# Patient Record
Sex: Female | Born: 1980 | Race: White | Hispanic: No | Marital: Married | Smoking: Former smoker
Health system: Southern US, Community
[De-identification: ages and names within clinical notes are randomized; demographics above are authoritative.]

## PROBLEM LIST (undated history)

## (undated) DIAGNOSIS — Z803 Family history of malignant neoplasm of breast: Secondary | ICD-10-CM

## (undated) DIAGNOSIS — Z87442 Personal history of urinary calculi: Secondary | ICD-10-CM

## (undated) DIAGNOSIS — D649 Anemia, unspecified: Secondary | ICD-10-CM

## (undated) DIAGNOSIS — K219 Gastro-esophageal reflux disease without esophagitis: Secondary | ICD-10-CM

## (undated) DIAGNOSIS — R51 Headache: Secondary | ICD-10-CM

## (undated) DIAGNOSIS — Z1371 Encounter for nonprocreative screening for genetic disease carrier status: Secondary | ICD-10-CM

## (undated) DIAGNOSIS — R519 Headache, unspecified: Secondary | ICD-10-CM

## (undated) DIAGNOSIS — Z9189 Other specified personal risk factors, not elsewhere classified: Secondary | ICD-10-CM

## (undated) HISTORY — DX: Other specified personal risk factors, not elsewhere classified: Z91.89

## (undated) HISTORY — PX: TUBAL LIGATION: SHX77

## (undated) HISTORY — DX: Encounter for nonprocreative screening for genetic disease carrier status: Z13.71

## (undated) HISTORY — PX: APPENDECTOMY: SHX54

## (undated) HISTORY — PX: WISDOM TOOTH EXTRACTION: SHX21

## (undated) HISTORY — DX: Family history of malignant neoplasm of breast: Z80.3

---

## 2004-03-05 ENCOUNTER — Emergency Department: Payer: Self-pay | Admitting: Unknown Physician Specialty

## 2004-07-28 ENCOUNTER — Emergency Department: Payer: Self-pay | Admitting: Emergency Medicine

## 2005-11-24 ENCOUNTER — Emergency Department: Payer: Self-pay

## 2006-01-06 ENCOUNTER — Ambulatory Visit: Payer: Self-pay

## 2010-01-07 ENCOUNTER — Emergency Department: Payer: Self-pay | Admitting: Emergency Medicine

## 2010-09-29 ENCOUNTER — Emergency Department: Payer: Self-pay | Admitting: Internal Medicine

## 2015-03-04 HISTORY — PX: BREAST EXCISIONAL BIOPSY: SUR124

## 2015-03-04 HISTORY — PX: BREAST BIOPSY: SHX20

## 2015-07-11 ENCOUNTER — Ambulatory Visit
Admission: RE | Admit: 2015-07-11 | Discharge: 2015-07-11 | Disposition: A | Discharge status: Home / Self Care | Payer: Self-pay | Source: Ambulatory Visit | Attending: Oncology | Admitting: Oncology

## 2015-07-11 ENCOUNTER — Ambulatory Visit: Payer: Self-pay | Attending: Oncology

## 2015-07-11 VITALS — BP 119/80 | HR 90 | Temp 99.2°F | Ht 65.75 in | Wt 187.4 lb

## 2015-07-11 DIAGNOSIS — N63 Unspecified lump in unspecified breast: Secondary | ICD-10-CM

## 2015-07-11 NOTE — Progress Notes (Signed)
Subjective:     Patient ID: Angie Weber, female   DOB: May 12, 1980, 35 y.o.   MRN: [id number]  HPI   Review of Systems     Objective:   Physical Exam  Pulmonary/Chest: Right breast exhibits no inverted nipple, no mass, no nipple discharge, no skin change and no tenderness. Left breast exhibits no inverted nipple, no mass, no nipple discharge, no skin change and no tenderness. Breasts are symmetrical.    Bilateral fibroglandular tissue       Assessment:    35 year old patient presents for [facility] visit.  Patient screened, and meets BCCCP eligibility.  Patient does not have insurance, Medicare or Medicaid.  Handout given on Affordable Care Act. Instructed patient on breast self-exam using teach back method.   Patient reports she has 4 maternal aunts, and her maternal grandmother diagnosed with breast cancer.  The youngest aunt was diagnosed in her thirties, and the remainder in their 73's.  Patient's husband present for exam , states he felt something like a mass in her left breast at 12:30, and patient feels mass at 5:30.  On clinical breast exam, palpated bilateral soft mobile nodularity with more prominence left breast 12:30.    Plan:     Sent for bilateral diagnostic mammogram with tomo,  and left breast ultrasound. Patient is on her menstrual cycle, and will be scheduled for pap.  She has not had a pap smear in 13 years.

## 2015-07-12 ENCOUNTER — Other Ambulatory Visit: Payer: Self-pay | Admitting: *Deleted

## 2015-07-12 DIAGNOSIS — N63 Unspecified lump in unspecified breast: Secondary | ICD-10-CM

## 2015-07-23 ENCOUNTER — Ambulatory Visit
Admission: RE | Admit: 2015-07-23 | Discharge: 2015-07-23 | Disposition: A | Discharge status: Home / Self Care | Payer: Self-pay | Source: Ambulatory Visit | Attending: Oncology | Admitting: Oncology

## 2015-07-23 ENCOUNTER — Other Ambulatory Visit: Payer: Self-pay | Admitting: *Deleted

## 2015-07-23 DIAGNOSIS — N63 Unspecified lump in unspecified breast: Secondary | ICD-10-CM

## 2015-07-23 HISTORY — PX: BREAST BIOPSY: SHX20

## 2015-07-24 LAB — SURGICAL PATHOLOGY

## 2015-07-24 NOTE — Progress Notes (Addendum)
Benign biopsy results received.   Spoke to Dr. Jamal Collin regarding referral for intraductal papilloma, and fibroadenoma negative for atypia, and negative for malignancy.  If patient is bothered by fibroadenoma, he will see her for consult regarding excision, otherwise no consult needed.  Phoned patient with results. She is to return to [facility] 07/25/15 for pap.  She will notify then whether she wants consult or not.  Copy to [facility].

## 2015-07-25 ENCOUNTER — Encounter: Payer: Self-pay | Admitting: *Deleted

## 2015-07-25 ENCOUNTER — Ambulatory Visit: Payer: Self-pay | Attending: Oncology | Admitting: *Deleted

## 2015-07-25 DIAGNOSIS — Z0000 Encounter for general adult medical examination without abnormal findings: Secondary | ICD-10-CM

## 2015-07-25 NOTE — Progress Notes (Signed)
Subjective:     Patient ID: Angie Weber, female   DOB: 1980-05-22, 35 y.o.   MRN: [id number]  HPI   Review of Systems     Objective:   Physical Exam  Genitourinary: No labial fusion. There is no rash, tenderness, lesion or injury on the right labia. There is no rash, tenderness, lesion or injury on the left labia. Cervix exhibits no motion tenderness, no discharge and no friability. Right adnexum displays no mass, no tenderness and no fullness. Left adnexum displays no mass, no tenderness and no fullness. No erythema, tenderness or bleeding in the vagina. No foreign body around the vagina. No signs of injury around the vagina. No vaginal discharge found.  Cervix is tilted and the os is located at 1:00       Assessment:     35 year old White female returns to [facility] for her pap only.  Had bilateral breast biopsy last week.  Al Pimple, RN dicussed results of the biopsy with Dr. Jamal Collin and the patient yesterday.  Patient states she does not want to have the fibroadenoma removed at this time.  Recommended that she follow up with mammogram next year.  She is agreeable.  Pelvic exam very difficult and could not visualize the cervical os after 3 attempts.  Discussed case with Dr. Fransisca Connors, our GYN Oncologist, and he came in and collected the pap smear specimen .  The cervical os was tilted upright and to the right at 1:00.      Plan:     Will follow up per BCCCP protocol.

## 2015-07-26 ENCOUNTER — Encounter: Payer: Self-pay | Admitting: *Deleted

## 2015-07-26 NOTE — Progress Notes (Signed)
Per Dr. Glennon Mac, radiologist, surgical consult recommended to discuss screening Breast MRI, and excision of ductal papilloma.  Scheduled appointment with Dr. Jamal Collin on Thursday August 09, 2015 at 2:45.  Also explained to patient that a 6 month follow-up breast ultrasound will be scheduled.

## 2015-07-28 LAB — PAP LB AND HPV HIGH-RISK
HPV, HIGH-RISK: POSITIVE — AB
PAP SMEAR COMMENT: 0

## 2015-08-03 ENCOUNTER — Encounter: Payer: Self-pay | Admitting: *Deleted

## 2015-08-09 ENCOUNTER — Ambulatory Visit (INDEPENDENT_AMBULATORY_CARE_PROVIDER_SITE_OTHER): Payer: PRIVATE HEALTH INSURANCE | Admitting: General Surgery

## 2015-08-09 ENCOUNTER — Encounter: Payer: Self-pay | Admitting: General Surgery

## 2015-08-09 VITALS — BP 100/60 | HR 80 | Resp 12 | Ht 64.5 in | Wt 188.0 lb

## 2015-08-09 DIAGNOSIS — Z803 Family history of malignant neoplasm of breast: Secondary | ICD-10-CM | POA: Diagnosis not present

## 2015-08-09 DIAGNOSIS — D369 Benign neoplasm, unspecified site: Secondary | ICD-10-CM

## 2015-08-09 NOTE — Progress Notes (Signed)
Patient ID: Angie Weber, female   DOB: [birth year], 35 y.o.   MRN: [id number]  Chief Complaint  Patient presents with  . Breast Problem    mass    HPI Angie Weber is a 35 y.o. female. Here today for breast evaluation. She states she noticed a breast lump back in April with some tenderness.  She noticed a couple of lumps in the left breast and sought evaluation by BCCCP. She denies any breast injury or trauma.  Right breast biopsy done 07-23-15 showing papilloma.   HPI  History reviewed. No pertinent past medical history.  Past Surgical History  Procedure Laterality Date  . Breast biopsy Right 07-23-15    BENIGN BREAST TISSUE WITH INTRADUCTAL PAPILLOMA AND COLUMNAR CELL     Family History  Problem Relation Age of Onset  . Breast cancer Maternal Aunt     4 mat. aunts 41's and 34's  . Breast cancer Maternal Grandmother   . Breast cancer Cousin     5 maternal cousins    Social History Social History  Substance Use Topics  . Smoking status: Current Every Day Smoker  . Smokeless tobacco: None  . Alcohol Use: No    Allergies  Allergen Reactions  . Penicillins Anaphylaxis  . Sulfur Hives    No current outpatient prescriptions on file.   No current facility-administered medications for this visit.    Review of Systems Review of Systems  Constitutional: Negative.   Respiratory: Negative.   Cardiovascular: Negative.     Blood pressure 100/60, pulse 80, resp. rate 12, height 5' 4.5" (1.638 m), weight 188 lb (85.276 kg), last menstrual period 08/06/2015.  Physical Exam Physical Exam  Constitutional: She is oriented to person, place, and time. She appears well-developed and well-nourished.  HENT:  Mouth/Throat: Oropharynx is clear and moist.  Eyes: Conjunctivae are normal. No scleral icterus.  Neck: Neck supple.  Cardiovascular: Normal rate, regular rhythm and normal heart sounds.   Pulmonary/Chest: Effort normal and breath sounds normal. Right breast exhibits no  inverted nipple, no mass, no nipple discharge, no skin change and no tenderness. Left breast exhibits mass. Left breast exhibits no inverted nipple, no nipple discharge, no skin change and no tenderness.  1 cm soft fatty mass left breast at 1 o'clock location periphery of breast.  Lymphadenopathy:    She has no cervical adenopathy.    She has no axillary adenopathy.  Neurological: She is alert and oriented to person, place, and time.  Skin: Skin is warm and dry.  Psychiatric: Her behavior is normal.    Data Reviewed Prior notes and pathology.  Assessment    Intraductal Papilloma right breast. Left breast biopsy with benign breast tissue.  No need for further surgical intervention. Risk status based on multiple members in family with breast cancer though not immediate family members.     Plan    Recommend genetic testing, BCCCP to investigate funding. MRI based on genetic test and approval.  Patient agreeable to plan.      PCP:  No Pcp Per Patient Ref BCCCP   SANKAR,SEEPLAPUTHUR G 08/12/2015, 9:40 AM

## 2015-08-09 NOTE — Patient Instructions (Signed)
The patient is aware to call back for any questions or concerns.  

## 2015-08-12 ENCOUNTER — Encounter: Payer: Self-pay | Admitting: General Surgery

## 2015-08-17 ENCOUNTER — Encounter: Payer: Self-pay | Admitting: *Deleted

## 2015-08-17 DIAGNOSIS — Z1371 Encounter for nonprocreative screening for genetic disease carrier status: Secondary | ICD-10-CM

## 2015-08-17 HISTORY — DX: Encounter for nonprocreative screening for genetic disease carrier status: Z13.71

## 2015-08-20 NOTE — Progress Notes (Signed)
Dr. Jamal Collin has met with patient and recommended that she have genetic testing based on her family history. Per Dr. Angie Fava request, I met with patient to review plan for testing.  Patient meets NCCN guidelines for genetic testing based on the following family history: (3) maternal aunts with breast cancer at ages 54, 15, and one at 75, (80) maternal first cousins with breast cancer diagnosed at ages 64, 52 and 51, 1 maternal first cousin with stomach and bowel cancer at age 18, maternal grandmother with lung cancer at age 61 and a paternal grandfather with lymphoma at age 85.  Since the patient is uninsured a [facility] financial assistance form was completed and shipped along with all paperwork, and specimen to [facility] via [employer].  Informed patient that Dr. Jamal Collin will get her results and review them with her.  She is to call if she has any questions or needs.  She is agreeable.

## 2015-08-21 ENCOUNTER — Telehealth: Payer: Self-pay | Admitting: *Deleted

## 2015-08-21 NOTE — Telephone Encounter (Signed)
Left patient a message to return my call.  I have her pap results and would like to review them with her.

## 2015-09-05 ENCOUNTER — Encounter: Payer: Self-pay | Admitting: *Deleted

## 2015-09-06 ENCOUNTER — Telehealth: Payer: Self-pay | Admitting: *Deleted

## 2015-09-06 NOTE — Telephone Encounter (Signed)
Notify patient that her BRCA genetic testing was negative. Her copy is at the office to pick up per Dr Jamal Collin. Thanks.

## 2015-09-06 NOTE — Telephone Encounter (Signed)
Notified patient as instructed, patient pleased. Discussed follow-up appointments, patient agrees  

## 2015-09-06 NOTE — Telephone Encounter (Signed)
-----   Message from Christene Lye, MD sent at 09/06/2015  8:13 AM EDT ----- Please inform pt her my risk assessment was negative. Please schedule her for right diagnostic mammogram in November with office appt after

## 2015-09-10 ENCOUNTER — Encounter: Payer: Self-pay | Admitting: General Surgery

## 2015-09-13 ENCOUNTER — Encounter: Payer: Self-pay | Admitting: *Deleted

## 2015-09-13 NOTE — Progress Notes (Signed)
Letter mailed to inform patient of her pap results and need to return in one year.  [facility] to [place].

## 2015-10-15 ENCOUNTER — Encounter: Payer: Self-pay | Admitting: *Deleted

## 2015-10-25 ENCOUNTER — Ambulatory Visit: Payer: PRIVATE HEALTH INSURANCE | Admitting: General Surgery

## 2015-11-19 ENCOUNTER — Encounter: Payer: Self-pay | Admitting: *Deleted

## 2015-11-26 ENCOUNTER — Ambulatory Visit: Payer: PRIVATE HEALTH INSURANCE | Admitting: General Surgery

## 2015-12-20 ENCOUNTER — Encounter: Payer: Self-pay | Admitting: *Deleted

## 2015-12-26 ENCOUNTER — Inpatient Hospital Stay: Payer: Self-pay

## 2015-12-26 ENCOUNTER — Ambulatory Visit (INDEPENDENT_AMBULATORY_CARE_PROVIDER_SITE_OTHER): Payer: PRIVATE HEALTH INSURANCE | Admitting: General Surgery

## 2015-12-26 ENCOUNTER — Encounter: Payer: Self-pay | Admitting: General Surgery

## 2015-12-26 VITALS — BP 120/80 | HR 82 | Resp 12 | Ht 64.0 in | Wt 180.0 lb

## 2015-12-26 DIAGNOSIS — N6310 Unspecified lump in the right breast, unspecified quadrant: Secondary | ICD-10-CM

## 2015-12-26 NOTE — Patient Instructions (Signed)
Patient to return for a right breast excision at [facility].

## 2015-12-26 NOTE — Progress Notes (Signed)
Patient ID: Angie Weber, female   DOB: 1980-08-07, 35 y.o.   MRN: [id number]  Chief Complaint  Patient presents with  . Follow-up    right breast    HPI Angie Weber is a 35 y.o. female here today for her follow up right breast papilloma . Patient states no  New breast symptoms.Since last visit she had BRCA testing which was negative.  I have reviewed the history of present illness with the patient.  HPI  Past Medical History:  Diagnosis Date  . BRCA negative 08-17-15   [facility]    Past Surgical History:  Procedure Laterality Date  . BREAST BIOPSY Right 07-23-15   BENIGN BREAST TISSUE WITH INTRADUCTAL PAPILLOMA AND COLUMNAR CELL     Family History  Problem Relation Age of Onset  . Breast cancer Maternal Aunt     4 mat. aunts 84's and 80's  . Breast cancer Maternal Grandmother   . Breast cancer Cousin     5 maternal cousins    Social History Social History  Substance Use Topics  . Smoking status: Current Every Day Smoker  . Smokeless tobacco: Not on file  . Alcohol use No    Allergies  Allergen Reactions  . Penicillins Anaphylaxis  . Sulfur Hives    No current outpatient prescriptions on file.   No current facility-administered medications for this visit.     Review of Systems Review of Systems  Blood pressure 120/80, pulse 82, resp. rate 12, height _0  (1.626 m), weight 180 lb (81.6 kg).  Physical Exam Physical Exam  Constitutional: She is oriented to person, place, and time. She appears well-developed and well-nourished.  Eyes: Conjunctivae are normal. No scleral icterus.  Neck: Neck supple.  Cardiovascular: Normal rate, regular rhythm and normal heart sounds.   Pulmonary/Chest: Effort normal and breath sounds normal. Right breast exhibits no inverted nipple, no mass, no nipple discharge, no skin change and no tenderness. Left breast exhibits no inverted nipple, no mass, no nipple discharge, no skin change and no tenderness.  Abdominal: Soft. Bowel  sounds are normal. There is no tenderness.  Lymphadenopathy:    She has no cervical adenopathy.    She has no axillary adenopathy.  Neurological: She is alert and oriented to person, place, and time.  Skin: Skin is warm and dry.    Data Reviewed Prior notes and ultrasound Path- one mass at 3.30 ocl near right nipple was a papilloma. Other biopsies were benign Assessment    [place] repeated today again shows a irregular 57m hypoechoic mass at 3.30 ocl near right nipple.    Plan   Right breast papilloma. Recommended excision. Pt is agreeable.    [facility]       This information has been scribed by JGaspar ColaCMA. Melvin Marmo G 12/26/2015, 12:32 PM

## 2015-12-27 NOTE — Progress Notes (Signed)
Received call from [facility] at [facility].  Patient is scheduled for excision of right breast mass on 01/11/16.  Informed office, and patient that BCCCP does not pay for all of the surgical procedure, and that patient will need to fill out Patient Financial  Assistance application.  Sent application to patient.  Notified pre-admit testing to apply BCCCP billing flag to patient registration .

## 2016-01-03 ENCOUNTER — Inpatient Hospital Stay: Admission: RE | Admit: 2016-01-03 | Payer: Self-pay | Source: Ambulatory Visit

## 2016-01-04 ENCOUNTER — Inpatient Hospital Stay: Admission: RE | Admit: 2016-01-04 | Payer: Self-pay | Source: Ambulatory Visit

## 2016-01-07 ENCOUNTER — Encounter
Admission: RE | Admit: 2016-01-07 | Discharge: 2016-01-07 | Disposition: A | Discharge status: Home / Self Care | Payer: Self-pay | Source: Ambulatory Visit | Attending: General Surgery | Admitting: General Surgery

## 2016-01-07 HISTORY — DX: Personal history of urinary calculi: Z87.442

## 2016-01-07 HISTORY — DX: Headache, unspecified: R51.9

## 2016-01-07 HISTORY — DX: Gastro-esophageal reflux disease without esophagitis: K21.9

## 2016-01-07 HISTORY — DX: Anemia, unspecified: D64.9

## 2016-01-07 HISTORY — DX: Headache: R51

## 2016-01-07 NOTE — Patient Instructions (Signed)
  Your procedure is scheduled on: 01-11-16 Report to Same Day Surgery 2nd floor medical mall To find out your arrival time please call [phone] between 1PM - 3PM on 01-10-16  Remember: Instructions that are not followed completely may result in serious medical risk, up to and including death, or upon the discretion of your surgeon and anesthesiologist your surgery may need to be rescheduled.    _x___ 1. Do not eat food or drink liquids after midnight. No gum chewing or hard candies.     __x__ 2. No Alcohol for 24 hours before or after surgery.   __x__3. No Smoking for 24 prior to surgery.   ____  4. Bring all medications with you on the day of surgery if instructed.    __x__ 5. Notify your doctor if there is any change in your medical condition     (cold, fever, infections).     Do not wear jewelry, make-up, hairpins, clips or nail polish.  Do not wear lotions, powders, or perfumes. You may wear deodorant.  Do not shave 48 hours prior to surgery. Men may shave face and neck.  Do not bring valuables to the hospital.    [facility] is not responsible for any belongings or valuables.               Contacts, dentures or bridgework may not be worn into surgery.  Leave your suitcase in the car. After surgery it may be brought to your room.  For patients admitted to the hospital, discharge time is determined by your treatment team.   Patients discharged the day of surgery will not be allowed to drive home.    Please read over the following fact sheets that you were given:   [facility] Preparing for Surgery and or MRSA Information   ____ Take these medicines the morning of surgery with A SIP OF WATER:    1. NONE  2.  3.  4.  5.  6.  ____Fleets enema or Magnesium Citrate as directed.   ____ Use CHG Soap or sage wipes as directed on instruction sheet   ____ Use inhalers on the day of surgery and bring to hospital day of surgery  ____ Stop metformin 2 days prior to  surgery    ____ Take 1/2 of usual insulin dose the night before surgery and none on the morning of  surgery.   _X___ Stop aspirin or coumadin, or plavix-STOP EXCEDRIN MIGRAINE NOW  x__ Stop Anti-inflammatories such as Advil, Aleve, Ibuprofen, Motrin, Naproxen,          Naprosyn, Goodies powders or aspirin products NOW-Ok to take Tylenol.   _X___ Stop supplements until after surgery-STOP MELATONIN NOW  ____ Bring C-Pap to the hospital.

## 2016-01-11 ENCOUNTER — Ambulatory Visit
Admission: RE | Admit: 2016-01-11 | Discharge: 2016-01-11 | Disposition: A | Discharge status: Home / Self Care | Payer: MEDICAID | Source: Ambulatory Visit | Attending: General Surgery | Admitting: General Surgery

## 2016-01-11 ENCOUNTER — Encounter
Admission: RE | Disposition: A | Discharge status: Home / Self Care | Payer: Self-pay | Source: Ambulatory Visit | Attending: General Surgery

## 2016-01-11 ENCOUNTER — Ambulatory Visit: Payer: MEDICAID | Admitting: Anesthesiology

## 2016-01-11 ENCOUNTER — Encounter: Payer: Self-pay | Admitting: *Deleted

## 2016-01-11 DIAGNOSIS — F17200 Nicotine dependence, unspecified, uncomplicated: Secondary | ICD-10-CM | POA: Insufficient documentation

## 2016-01-11 DIAGNOSIS — Z9889 Other specified postprocedural states: Secondary | ICD-10-CM | POA: Insufficient documentation

## 2016-01-11 DIAGNOSIS — Z803 Family history of malignant neoplasm of breast: Secondary | ICD-10-CM | POA: Insufficient documentation

## 2016-01-11 DIAGNOSIS — N6310 Unspecified lump in the right breast, unspecified quadrant: Secondary | ICD-10-CM

## 2016-01-11 DIAGNOSIS — Z880 Allergy status to penicillin: Secondary | ICD-10-CM | POA: Insufficient documentation

## 2016-01-11 DIAGNOSIS — D241 Benign neoplasm of right breast: Secondary | ICD-10-CM | POA: Diagnosis not present

## 2016-01-11 DIAGNOSIS — Z882 Allergy status to sulfonamides status: Secondary | ICD-10-CM | POA: Insufficient documentation

## 2016-01-11 HISTORY — PX: BREAST LUMPECTOMY: SHX2

## 2016-01-11 LAB — POCT PREGNANCY, URINE: Preg Test, Ur: NEGATIVE

## 2016-01-11 SURGERY — BREAST LUMPECTOMY
Anesthesia: General | Site: Breast | Laterality: Right | Wound class: Clean

## 2016-01-11 MED ORDER — TRAMADOL HCL 50 MG PO TABS: 50.0000 mg | ORAL_TABLET | Freq: Four times a day (QID) | ORAL | 0 refills | Status: DC | PRN

## 2016-01-11 MED ORDER — FAMOTIDINE 20 MG PO TABS
20.0000 mg | ORAL_TABLET | Freq: Once | ORAL | Status: AC
  Administered 2016-01-11: 20 mg via ORAL

## 2016-01-11 MED ORDER — BUPIVACAINE HCL (PF) 0.5 % IJ SOLN
INTRAMUSCULAR | Status: DC | PRN
  Administered 2016-01-11: 15 mL

## 2016-01-11 MED ORDER — FENTANYL CITRATE (PF) 100 MCG/2ML IJ SOLN: 25.0000 ug | INTRAMUSCULAR | Status: DC | PRN

## 2016-01-11 MED ORDER — LACTATED RINGERS IV SOLN
INTRAVENOUS | Status: DC
  Administered 2016-01-11: 07:00:00 via INTRAVENOUS

## 2016-01-11 MED ORDER — SODIUM CHLORIDE 0.9 % IJ SOLN
INTRAMUSCULAR | Status: AC
  Filled 2016-01-11: qty 50

## 2016-01-11 MED ORDER — PROPOFOL 10 MG/ML IV BOLUS
INTRAVENOUS | Status: DC | PRN
  Administered 2016-01-11: 200 mg via INTRAVENOUS

## 2016-01-11 MED ORDER — KETOROLAC TROMETHAMINE 30 MG/ML IJ SOLN
INTRAMUSCULAR | Status: DC | PRN
  Administered 2016-01-11: 30 mg via INTRAVENOUS

## 2016-01-11 MED ORDER — ONDANSETRON HCL 4 MG/2ML IJ SOLN: 4.0000 mg | Freq: Once | INTRAMUSCULAR | Status: DC | PRN

## 2016-01-11 MED ORDER — GLYCOPYRROLATE 0.2 MG/ML IJ SOLN
INTRAMUSCULAR | Status: DC | PRN
  Administered 2016-01-11: 0.2 mg via INTRAVENOUS

## 2016-01-11 MED ORDER — FAMOTIDINE 20 MG PO TABS
ORAL_TABLET | ORAL | Status: AC
  Filled 2016-01-11: qty 1

## 2016-01-11 MED ORDER — LIDOCAINE HCL (CARDIAC) 20 MG/ML IV SOLN
INTRAVENOUS | Status: DC | PRN
  Administered 2016-01-11: 100 mg via INTRAVENOUS

## 2016-01-11 MED ORDER — METHYLENE BLUE 0.5 % INJ SOLN
INTRAVENOUS | Status: AC
  Filled 2016-01-11: qty 10

## 2016-01-11 MED ORDER — BUPIVACAINE HCL (PF) 0.5 % IJ SOLN
INTRAMUSCULAR | Status: AC
  Filled 2016-01-11: qty 30

## 2016-01-11 MED ORDER — FENTANYL CITRATE (PF) 100 MCG/2ML IJ SOLN
INTRAMUSCULAR | Status: DC | PRN
  Administered 2016-01-11 (×2): 50 ug via INTRAVENOUS

## 2016-01-11 MED ORDER — CHLORHEXIDINE GLUCONATE CLOTH 2 % EX PADS: 6.0000 | MEDICATED_PAD | Freq: Once | CUTANEOUS | Status: DC

## 2016-01-11 MED ORDER — MIDAZOLAM HCL 2 MG/2ML IJ SOLN
INTRAMUSCULAR | Status: DC | PRN
Start: 2016-01-11 — End: 2016-01-11
  Administered 2016-01-11: 2 mg via INTRAVENOUS

## 2016-01-11 MED ORDER — ONDANSETRON HCL 4 MG/2ML IJ SOLN
INTRAMUSCULAR | Status: DC | PRN
  Administered 2016-01-11: 4 mg via INTRAVENOUS

## 2016-01-11 SURGICAL SUPPLY — 32 items
BLADE SURG 15 STRL SS SAFETY (BLADE) ×6 IMPLANT
BULB RESERV EVAC DRAIN JP 100C (MISCELLANEOUS) IMPLANT
CANISTER SUCT 1200ML W/VALVE (MISCELLANEOUS) ×3 IMPLANT
CHLORAPREP W/TINT 26ML (MISCELLANEOUS) ×3 IMPLANT
CNTNR SPEC 2.5X3XGRAD LEK (MISCELLANEOUS) ×2
CONT SPEC 4OZ STER OR WHT (MISCELLANEOUS) ×4
CONTAINER SPEC 2.5X3XGRAD LEK (MISCELLANEOUS) ×2 IMPLANT
COVER PROBE FLX POLY STRL (MISCELLANEOUS) IMPLANT
DERMABOND ADVANCED (GAUZE/BANDAGES/DRESSINGS) ×2
DERMABOND ADVANCED .7 DNX12 (GAUZE/BANDAGES/DRESSINGS) ×1 IMPLANT
DEVICE LOCALIZATION ULTRAWIRE (WIRE) IMPLANT
DRAIN CHANNEL JP 15F RND 16 (MISCELLANEOUS) IMPLANT
DRAPE LAPAROTOMY TRNSV 106X77 (MISCELLANEOUS) ×3 IMPLANT
ELECT REM PT RETURN 9FT ADLT (ELECTROSURGICAL) ×3
ELECTRODE REM PT RTRN 9FT ADLT (ELECTROSURGICAL) ×1 IMPLANT
GLOVE BIO SURGEON STRL SZ7 (GLOVE) ×3 IMPLANT
GOWN STRL REUS W/ TWL LRG LVL3 (GOWN DISPOSABLE) ×3 IMPLANT
GOWN STRL REUS W/TWL LRG LVL3 (GOWN DISPOSABLE) ×6
HARMONIC SCALPEL FOCUS (MISCELLANEOUS) IMPLANT
KIT RM TURNOVER STRD PROC AR (KITS) ×3 IMPLANT
LABEL OR SOLS (LABEL) ×3 IMPLANT
LIQUID BAND (GAUZE/BANDAGES/DRESSINGS) ×3 IMPLANT
MARGIN MAP 10MM (MISCELLANEOUS) ×3 IMPLANT
NEEDLE HYPO 25X1 1.5 SAFETY (NEEDLE) ×3 IMPLANT
PACK BASIN MINOR ARMC (MISCELLANEOUS) ×3 IMPLANT
SUT ETH BLK MONO 3 0 FS 1 12/B (SUTURE) ×3 IMPLANT
SUT MNCRL AB 3-0 PS2 27 (SUTURE) ×3 IMPLANT
SUT VIC AB 2-0 BRD 54 (SUTURE) ×3 IMPLANT
SUT VIC AB 2-0 CT2 27 (SUTURE) ×6 IMPLANT
SYR CONTROL 10ML (SYRINGE) ×3 IMPLANT
ULTRAWIRE LOCALIZATION DEVICE (WIRE)
WATER STERILE IRR 1000ML POUR (IV SOLUTION) ×3 IMPLANT

## 2016-01-11 NOTE — Anesthesia Procedure Notes (Signed)
Procedure Name: LMA Insertion Date/Time: 01/11/2016 7:27 AM Performed by: Doreen Salvage Pre-anesthesia Checklist: Patient identified, Patient being monitored, Timeout performed, Emergency Drugs available and Suction available Patient Re-evaluated:Patient Re-evaluated prior to inductionOxygen Delivery Method: Circle system utilized Preoxygenation: Pre-oxygenation with 100% oxygen Intubation Type: IV induction Ventilation: Mask ventilation without difficulty LMA: LMA inserted LMA Size: 3.5 Tube type: Oral Number of attempts: 1 Placement Confirmation: positive ETCO2 and breath sounds checked- equal and bilateral Tube secured with: Tape Dental Injury: Teeth and Oropharynx as per pre-operative assessment

## 2016-01-11 NOTE — Discharge Instructions (Signed)

## 2016-01-11 NOTE — H&P (View-Only) (Signed)
Patient ID: Angie Weber, female   DOB: 09/06/1980, 35 y.o.   MRN: [id number]  Chief Complaint  Patient presents with  . Follow-up    right breast    HPI Angie Weber is a 35 y.o. female here today for her follow up right breast papilloma . Patient states no  New breast symptoms.Since last visit she had BRCA testing which was negative.  I have reviewed the history of present illness with the patient.  HPI  Past Medical History:  Diagnosis Date  . BRCA negative 08-17-15   MyRisk    Past Surgical History:  Procedure Laterality Date  . BREAST BIOPSY Right 07-23-15   BENIGN BREAST TISSUE WITH INTRADUCTAL PAPILLOMA AND COLUMNAR CELL     Family History  Problem Relation Age of Onset  . Breast cancer Maternal Aunt     4 mat. aunts 30's and 40's  . Breast cancer Maternal Grandmother   . Breast cancer Cousin     5 maternal cousins    Social History Social History  Substance Use Topics  . Smoking status: Current Every Day Smoker  . Smokeless tobacco: Not on file  . Alcohol use No    Allergies  Allergen Reactions  . Penicillins Anaphylaxis  . Sulfur Hives    No current outpatient prescriptions on file.   No current facility-administered medications for this visit.     Review of Systems Review of Systems  Blood pressure 120/80, pulse 82, resp. rate 12, height 5' 4" (1.626 m), weight 180 lb (81.6 kg).  Physical Exam Physical Exam  Constitutional: She is oriented to person, place, and time. She appears well-developed and well-nourished.  Eyes: Conjunctivae are normal. No scleral icterus.  Neck: Neck supple.  Cardiovascular: Normal rate, regular rhythm and normal heart sounds.   Pulmonary/Chest: Effort normal and breath sounds normal. Right breast exhibits no inverted nipple, no mass, no nipple discharge, no skin change and no tenderness. Left breast exhibits no inverted nipple, no mass, no nipple discharge, no skin change and no tenderness.  Abdominal: Soft. Bowel  sounds are normal. There is no tenderness.  Lymphadenopathy:    She has no cervical adenopathy.    She has no axillary adenopathy.  Neurological: She is alert and oriented to person, place, and time.  Skin: Skin is warm and dry.    Data Reviewed Prior notes and ultrasound Path- one mass at 3.30 ocl near right nipple was a papilloma. Other biopsies were benign Assessment    US repeated today again shows a irregular 5mm hypoechoic mass at 3.30 ocl near right nipple.    Plan   Right breast papilloma. Recommended excision. Pt is agreeable.    This patient's surgery has been scheduled for 01-11-16 at [facility].       This information has been scribed by Jessica Qualls CMA. Kaedance Magos G 12/26/2015, 12:32 PM   

## 2016-01-11 NOTE — Anesthesia Postprocedure Evaluation (Signed)
Anesthesia Post Note  Patient: Angie Weber  Procedure(s) Performed: Procedure(s) (LRB): BREAST LUMPECTOMY (Right)  Patient location during evaluation: PACU Anesthesia Type: General Level of consciousness: awake and alert Pain management: pain level controlled Vital Signs Assessment: post-procedure vital signs reviewed and stable Respiratory status: spontaneous breathing and respiratory function stable Cardiovascular status: stable Anesthetic complications: no    Last Vitals:  Vitals:   01/11/16 0612 01/11/16 0837  BP: 130/80 125/62  Pulse: 78 91  Resp: 16 13  Temp: 36.6 C 36.6 C    Last Pain:  Vitals:   01/11/16 0837  TempSrc: Temporal                 Roxsana Riding K

## 2016-01-11 NOTE — Anesthesia Preprocedure Evaluation (Signed)
Anesthesia Evaluation  Patient identified by MRN, date of birth, ID band Patient awake    Reviewed: Allergy & Precautions, NPO status , Patient's Chart, lab work & pertinent test results  History of Anesthesia Complications Negative for: history of anesthetic complications  Airway Mallampati: II       Dental   Pulmonary Current Smoker,           Cardiovascular negative cardio ROS       Neuro/Psych negative neurological ROS     GI/Hepatic negative GI ROS, Neg liver ROS, GERD  ,  Endo/Other  negative endocrine ROS  Renal/GU negative Renal ROS     Musculoskeletal   Abdominal   Peds  Hematology  (+) anemia ,   Anesthesia Other Findings   Reproductive/Obstetrics                             Anesthesia Physical Anesthesia Plan  ASA: II  Anesthesia Plan: General   Post-op Pain Management:    Induction:   Airway Management Planned: LMA  Additional Equipment:   Intra-op Plan:   Post-operative Plan:   Informed Consent: I have reviewed the patients History and Physical, chart, labs and discussed the procedure including the risks, benefits and alternatives for the proposed anesthesia with the patient or authorized representative who has indicated his/her understanding and acceptance.     Plan Discussed with:   Anesthesia Plan Comments:         Anesthesia Quick Evaluation

## 2016-01-11 NOTE — Op Note (Signed)
Preop diagnosis: Papilloma right breast  Post op diagnosis: Same  Operation: Excision papilloma right breast with ultrasound guidance  Surgeon: Mckinley Jewel  Assistant:     Anesthesia: Gen.  Complications: None  EBL: Less than 5 mL  Drains: None  Description: Patient was put to sleep with an LMA and the right breast prepped and draped as sterile field. Timeout was performed. The patient had a 5 mm mass just medial to the right nipple. This was previously core biopsied showing it was an intraductal papilloma with some columnar cell metaplasia. Ultrasound probe was sterile, was brought up and the mass in question was identified lying around the 3:30 to 4:00 location 1 cm from the nipple. This area was marked. A circumareolar incision was mapped out from about the 3 to 5:00 location. Marcaine mixed with 1. Xylocaine was instilled in the surrounding area for postop analgesia. Skin incision was then made and the areolar skin was elevated towards the nipple and slightly beyond. The area of concern was then excised out completely. The excised tissue was closed with the ultrasound and showed presence of the papilloma within. The previously placed clip was also visible of the surface of the excised tissue. After ensuring hemostasis the area was irrigated with some saline. Subcutaneous tissue and the deeper tissues closed with 2-0 Vicryl. The skin approximated with a running subcuticular stitch of 3-0 Monocryl. Dermabond was applied. Patient subsequently returned recovery room stable condition

## 2016-01-11 NOTE — Transfer of Care (Signed)
Immediate Anesthesia Transfer of Care Note  Patient: Angie Weber  Procedure(s) Performed: Procedure(s): BREAST LUMPECTOMY (Right)  Patient Location: PACU  Anesthesia Type:General  Level of Consciousness: sedated  Airway & Oxygen Therapy: Patient Spontanous Breathing and Patient connected to face mask oxygen  Post-op Assessment: Report given to RN and Post -op Vital signs reviewed and stable  Post vital signs: Reviewed and stable  Last Vitals:  Vitals:   01/11/16 0612 01/11/16 0837  BP: 130/80 125/62  Pulse: 78 91  Resp: 16 13  Temp: 36.6 C 123XX123 C    Complications: No apparent anesthesia complications

## 2016-01-11 NOTE — OR Nursing (Signed)
Iv can be started in Right hand per Dr Jamal Collin

## 2016-01-11 NOTE — Interval H&P Note (Signed)
History and Physical Interval Note:  01/11/2016 7:07 AM  Angie Weber  has presented today for surgery, with the diagnosis of [place] RIGHT BREAST  The various methods of treatment have been discussed with the patient and family. After consideration of risks, benefits and other options for treatment, the patient has consented to  Procedure(s): BREAST LUMPECTOMY (Right) as a surgical intervention .  The patient's history has been reviewed, patient examined, no change in status, stable for surgery.  I have reviewed the patient's chart and labs.  Questions were answered to the patient's satisfaction.     SANKAR,SEEPLAPUTHUR G

## 2016-01-14 LAB — SURGICAL PATHOLOGY

## 2016-01-15 ENCOUNTER — Telehealth: Payer: Self-pay | Admitting: *Deleted

## 2016-01-15 NOTE — Telephone Encounter (Signed)
-----   Message from Christene Lye, MD sent at 01/14/2016  1:51 PM EST ----- Inform pt path was normal.

## 2016-01-15 NOTE — Telephone Encounter (Signed)
Notified patient as instructed, patient pleased. Discussed follow-up appointments, patient agrees  

## 2016-01-17 ENCOUNTER — Encounter: Payer: Self-pay | Admitting: General Surgery

## 2016-01-17 ENCOUNTER — Ambulatory Visit (INDEPENDENT_AMBULATORY_CARE_PROVIDER_SITE_OTHER): Payer: PRIVATE HEALTH INSURANCE | Admitting: General Surgery

## 2016-01-17 VITALS — BP 124/74 | HR 80 | Resp 12 | Ht 64.0 in | Wt 179.0 lb

## 2016-01-17 DIAGNOSIS — D369 Benign neoplasm, unspecified site: Secondary | ICD-10-CM

## 2016-01-17 DIAGNOSIS — Z803 Family history of malignant neoplasm of breast: Secondary | ICD-10-CM

## 2016-01-17 NOTE — Progress Notes (Signed)
Patient ID: Angie Weber, female   DOB: 06/05/1980, 35 y.o.   MRN: [id number]  Chief Complaint  Patient presents with  . Routine Post Op    lumpectomy    HPI Angie Weber is a 35 y.o. female here today for her post op right lumpectomy done on 01/11/2016. Patient states she is doing well.  I have reviewed the history of present illness with the patient.  HPI  Past Medical History:  Diagnosis Date  . Anemia    H/O AS A CHILD  . BRCA negative 08-17-15   MyRisk  . GERD (gastroesophageal reflux disease)    OCC-NO MEDS  . Headache    MIGRAINES  . History of kidney stones     Past Surgical History:  Procedure Laterality Date  . BREAST BIOPSY Right 07-23-15   BENIGN BREAST TISSUE WITH INTRADUCTAL PAPILLOMA AND COLUMNAR CELL   . BREAST LUMPECTOMY Right 01/11/2016   Procedure: BREAST LUMPECTOMY;  Surgeon: Seeplaputhur G Sankar, MD;  Location: [facility] ORS;  Service: General;  Laterality: Right;  . TUBAL LIGATION    . WISDOM TOOTH EXTRACTION      Family History  Problem Relation Age of Onset  . Breast cancer Maternal Aunt     4 mat. aunts 30's and 40's  . Breast cancer Maternal Grandmother   . Breast cancer Cousin     5 maternal cousins    Social History Social History  Substance Use Topics  . Smoking status: Current Every Day Smoker    Packs/day: 0.50    Years: 17.00    Types: Cigarettes  . Smokeless tobacco: Never Used  . Alcohol use No    Allergies  Allergen Reactions  . Penicillins Anaphylaxis  . Sulfa Antibiotics Other (See Comments)    SORES IN MOUTH AND THROAT    Current Outpatient Prescriptions  Medication Sig Dispense Refill  . acetaminophen (TYLENOL) 325 MG tablet Take 650 mg by mouth every 6 (six) hours as needed for moderate pain.    . aspirin-acetaminophen-caffeine (EXCEDRIN MIGRAINE) 250-250-65 MG tablet Take 2 tablets by mouth 2 (two) times daily as needed for headache.    . Melatonin 10 MG TABS Take 1 tablet by mouth at bedtime.    . traMADol (ULTRAM)  50 MG tablet Take 1 tablet (50 mg total) by mouth every 6 (six) hours as needed. 20 tablet 0   No current facility-administered medications for this visit.     Review of Systems Review of Systems  Constitutional: Negative.   Respiratory: Negative.   Cardiovascular: Negative.     Blood pressure 124/74, pulse 80, resp. rate 12, height 5' 4" (1.626 m), weight 179 lb (81.2 kg), last menstrual period 12/24/2015.  Physical Exam Physical Exam  Constitutional: She is oriented to person, place, and time. She appears well-developed and well-nourished.  Neurological: She is alert and oriented to person, place, and time.  Skin: Skin is warm and dry.  Right breast circumareolar incision is clean and healing well.  Mild surrounding ecchymosis. No hematoma or seroma   Data Reviewed Path- sclerotic papilloma.   Assessment    Benign papilloma right breast- pt advised.     Plan    Return in 6 months with Bilateral diagnostic mammogram    This information has been scribed by Jessica Qualls CMA.    SANKAR,SEEPLAPUTHUR G 01/17/2016, 3:05 PM   

## 2016-01-17 NOTE — Patient Instructions (Signed)
Return in 6 months with Bilateral diagnostic mammogram

## 2016-07-02 ENCOUNTER — Ambulatory Visit: Payer: Self-pay | Attending: Oncology | Admitting: *Deleted

## 2016-07-02 ENCOUNTER — Encounter: Payer: Self-pay | Admitting: *Deleted

## 2016-07-02 VITALS — BP 139/80 | HR 82 | Temp 95.8°F | Resp 18 | Ht 65.0 in | Wt 194.0 lb

## 2016-07-02 DIAGNOSIS — N63 Unspecified lump in unspecified breast: Secondary | ICD-10-CM

## 2016-07-02 DIAGNOSIS — Z0000 Encounter for general adult medical examination without abnormal findings: Secondary | ICD-10-CM

## 2016-07-02 NOTE — Progress Notes (Signed)
Subjective:     Patient ID: Angie Weber, female   DOB: [birth year], 36 y.o.   MRN: [id number]  HPI   Review of Systems     Objective:   Physical Exam  Pulmonary/Chest: Right breast exhibits no inverted nipple, no mass, no nipple discharge, no skin change and no tenderness. Left breast exhibits no inverted nipple, no mass, no nipple discharge, no skin change and no tenderness. Breasts are symmetrical.    Abdominal: There is no splenomegaly or hepatomegaly.    Genitourinary: No labial fusion. There is no rash, tenderness, lesion or injury on the right labia. There is no rash, tenderness, lesion or injury on the left labia. Uterus is not deviated and not fixed. Cervix exhibits no motion tenderness and no friability. Right adnexum displays no mass, no tenderness and no fullness. Left adnexum displays no mass, no tenderness and no fullness. No erythema, tenderness or bleeding in the vagina. No foreign body in the vagina. No signs of injury around the vagina. No vaginal discharge found.         Assessment:     36 year old White female returns to [facility] for short term follow of her last pap on May 2017.  Last pap was negative but HPV positive.  Also patient had bilateral breast biopsies in May 2017 that were all benign.  Radiologist recommended an MRI and genetic testing at that time.  Patient was referred to Dr. Jamal Weber.  Genetic testing was completed last May, and patient was BRCA negative.  The patient returned to Dr. Jamal Weber in November 2017, and had removal of a benign right breast papilloma.  Per Dr. Angie Weber notes, he requested the patient have a diagnostic mammogram in one year.  Clinical breast exam with diffuse fibroglandular like tissue.  Discussed with Angie Weber, the coordinator in the breast center, the appropriate order since no abnormal findings on clinical breast exam, no recommendations from the radiologist, but Dr. Angie Weber recommendation for bilateral diagnostic.  We agreed  to proceed with diagnostic imaging.  Specimen collected for pap smear without difficulty.      Plan:     Bilateral diagnostic mammogram and ultrasound ordered.  Specimen for pap smear sent to the lab.  Will follow per [facility] protocol.

## 2016-07-02 NOTE — Patient Instructions (Signed)
HPV Test The human papillomavirus (HPV) test is used to look for high-risk types of HPV infection. HPV is a group of about 100 viruses. Many of these viruses cause growths on, in, or around the genitals. Most HPV viruses cause infections that usually go away without treatment. However, HPV types 6, 11, 16, and 18 are considered high-risk types of HPV that can increase your risk of cancer of the cervix or anus if the infection is left untreated. An HPV test identifies the DNA (genetic) strands of the HPV infection, so it is also referred to as the HPV DNA test. Although HPV is found in both males and females, the HPV test is only used to screen for increased cancer risk in females:  With an abnormal Pap test.  After treatment of an abnormal Pap test.  Between the ages of 30 and 65.  After treatment of a high-risk HPV infection. The HPV test may be done at the same time as a pelvic exam and Pap test in females over the age of 30. Both the HPV test and Pap test require a sample of cells from the cervix. How do I prepare for this test?  Do not douche or take a bath for 24-48 hours before the test or as directed by your health care provider.  Do not have sex for 24-48 hours before the test or as directed by your health care provider.  You may be asked to reschedule the test if you are menstruating.  You will be asked to urinate before the test. What do the results mean? It is your responsibility to obtain your test results. Ask the lab or department performing the test when and how you will get your results. Talk with your health care provider if you have any questions about your results. Your result will be negative or positive. Meaning of Negative Test Results  A negative HPV test result means that no HPV was found, and it is very likely that you do not have HPV. Meaning of Positive Test Results  A positive HPV test result indicates that you have HPV.  If your test result shows the presence  of any high-risk HPV strains, you may have an increased risk of developing cancer of the cervix or anus if the infection is left untreated.  If any low-risk HPV strains are found, you are not likely to have an increased risk of cancer. Discuss your test results with your health care provider. He or she will use the results to make a diagnosis and determine a treatment plan that is right for you. Talk with your health care provider to discuss your results, treatment options, and if necessary, the need for more tests. Talk with your health care provider if you have any questions about your results. This information is not intended to replace advice given to you by your health care provider. Make sure you discuss any questions you have with your health care provider. Document Released: 03/14/2004 Document Revised: 10/24/2015 Document Reviewed: 07/05/2013 Elsevier Interactive Patient Education  2017 Elsevier Inc.  Gave patient hand-out, Women Staying Healthy, Active and Well from [facility], with education on breast health, pap smears, heart and colon health.  

## 2016-07-07 LAB — PAP LB AND HPV HIGH-RISK
HPV, high-risk: POSITIVE — AB
PAP SMEAR COMMENT: 0

## 2016-07-10 ENCOUNTER — Ambulatory Visit: Payer: PRIVATE HEALTH INSURANCE | Admitting: General Surgery

## 2016-07-14 ENCOUNTER — Telehealth: Payer: Self-pay | Admitting: *Deleted

## 2016-07-14 NOTE — Telephone Encounter (Addendum)
Left patient a message to return my call.  I have results from her pap smear that is negative / HPV positive.  She will need referral to gyn since this is her second HPV positive pap.  Patient returned my call.  She has been scheduled with Dr. Georgianne Fick on June 7th at 2:30.

## 2016-07-25 ENCOUNTER — Ambulatory Visit
Admission: RE | Admit: 2016-07-25 | Discharge: 2016-07-25 | Disposition: A | Discharge status: Home / Self Care | Payer: MEDICAID | Source: Ambulatory Visit | Attending: Oncology | Admitting: Oncology

## 2016-07-25 DIAGNOSIS — N63 Unspecified lump in unspecified breast: Secondary | ICD-10-CM

## 2016-07-30 ENCOUNTER — Ambulatory Visit: Payer: Self-pay

## 2016-07-31 ENCOUNTER — Ambulatory Visit (INDEPENDENT_AMBULATORY_CARE_PROVIDER_SITE_OTHER): Payer: Self-pay | Admitting: General Surgery

## 2016-07-31 ENCOUNTER — Encounter: Payer: Self-pay | Admitting: General Surgery

## 2016-07-31 VITALS — BP 120/90 | HR 83 | Resp 12 | Ht 64.0 in | Wt 192.0 lb

## 2016-07-31 DIAGNOSIS — D216 Benign neoplasm of connective and other soft tissue of trunk, unspecified: Secondary | ICD-10-CM

## 2016-07-31 DIAGNOSIS — Z803 Family history of malignant neoplasm of breast: Secondary | ICD-10-CM

## 2016-07-31 DIAGNOSIS — D369 Benign neoplasm, unspecified site: Secondary | ICD-10-CM

## 2016-07-31 NOTE — Patient Instructions (Signed)
The patient is aware to call back for any questions or concerns.  

## 2016-07-31 NOTE — Progress Notes (Signed)
Patient ID: Angie Weber, female   DOB: 1981/02/17, 36 y.o.   MRN: [id number]  Chief Complaint  Patient presents with  . Follow-up    HPI Angie Weber is a 36 y.o. female.  who presents for her follow up breast evaluation. The most recent mammogram was done on 07-25-16.  Patient does perform regular self breast checks and gets regular mammograms done.    No new breast issues.  Complains of a new onset intermittent pain that originates in lateral chest wall and travels medially. During these episodes, patient reports experiencing pain with breathing. HPI  Past Medical History:  Diagnosis Date  . Anemia    H/O AS A CHILD  . BRCA negative 08-17-15   MyRisk  . GERD (gastroesophageal reflux disease)    OCC-NO MEDS  . Headache    MIGRAINES  . History of kidney stones     Past Surgical History:  Procedure Laterality Date  . BREAST BIOPSY Right 07-23-15   BENIGN BREAST TISSUE WITH INTRADUCTAL PAPILLOMA AND COLUMNAR CELL   . BREAST BIOPSY Left 2017   NEG  . BREAST EXCISIONAL BIOPSY Right 2017   NEG  . BREAST LUMPECTOMY Right 01/11/2016   Procedure: BREAST LUMPECTOMY;  Surgeon: Christene Lye, MD;  Location: [facility] ORS;  Service: General;  Laterality: Right;  . TUBAL LIGATION    . WISDOM TOOTH EXTRACTION      Family History  Problem Relation Age of Onset  . Breast cancer Maternal Aunt        4 mat. aunts 1's and 82's  . Breast cancer Maternal Grandmother   . Breast cancer Cousin        5 maternal cousins    Social History Social History  Substance Use Topics  . Smoking status: Current Every Day Smoker    Packs/day: 0.50    Years: 17.00    Types: Cigarettes  . Smokeless tobacco: Never Used  . Alcohol use No    Allergies  Allergen Reactions  . Penicillins Anaphylaxis  . Sulfa Antibiotics Other (See Comments)    SORES IN MOUTH AND THROAT    Current Outpatient Prescriptions  Medication Sig Dispense Refill  . acetaminophen (TYLENOL) 325 MG tablet Take 650 mg  by mouth every 6 (six) hours as needed for moderate pain.    Marland Kitchen aspirin-acetaminophen-caffeine (EXCEDRIN MIGRAINE) 250-250-65 MG tablet Take 2 tablets by mouth 2 (two) times daily as needed for headache.    . Melatonin 10 MG TABS Take 1 tablet by mouth at bedtime.     No current facility-administered medications for this visit.     Review of Systems Review of Systems  Constitutional: Negative.   Respiratory: Negative.   Cardiovascular: Negative.     Blood pressure 120/90, pulse 83, resp. rate 12, height '5\' 4"'$  (1.626 m), weight 192 lb (87.1 kg), last menstrual period 07/07/2016, SpO2 98 %.  Physical Exam Physical Exam  Constitutional: She is oriented to person, place, and time. She appears well-developed and well-nourished.  Eyes: Conjunctivae are normal. No scleral icterus.  Neck: Neck supple.  Cardiovascular: Normal rate, regular rhythm and normal heart sounds.   Pulmonary/Chest: Effort normal and breath sounds normal. Right breast exhibits no inverted nipple, no mass, no nipple discharge, no skin change and no tenderness. Left breast exhibits no inverted nipple, no mass, no nipple discharge, no skin change and no tenderness. Breasts are asymmetrical.  Abdominal: Soft. Bowel sounds are normal. There is no hepatomegaly.  Lymphadenopathy:    She has  no cervical adenopathy.    She has no axillary adenopathy.  Neurological: She is alert and oriented to person, place, and time.  Skin: Skin is warm and dry.  Psychiatric: She has a normal mood and affect. Her behavior is normal.    Data Reviewed Prior notes and mammogram reviewed BRCA neg in 2017  Assessment    Ductal Papilloma history Extensive family history of breast cancer BRCA negative Stable exam otherwise    Plan    Recommended yearly breast exams, can be seen here as needed. Mammogram to resume at age 39. Patient to call if she has any questions or concerns.     HPI, Physical Exam, Assessment and Plan have been  scribed under the direction and in the presence of Mckinley Jewel, MD  Karie Fetch, RN  I have completed the exam and reviewed the above documentation for accuracy and completeness.  I agree with the above.  Haematologist has been used and any errors in dictation or transcription are unintentional.  Eliska Hamil G. Jamal Collin, M.D., F.A.C.S.    Junie Panning G 08/01/2016, 12:49 PM   S.

## 2016-08-07 ENCOUNTER — Encounter: Payer: Self-pay | Admitting: Obstetrics and Gynecology

## 2016-08-07 ENCOUNTER — Ambulatory Visit (INDEPENDENT_AMBULATORY_CARE_PROVIDER_SITE_OTHER): Payer: Self-pay | Admitting: Obstetrics and Gynecology

## 2016-08-07 VITALS — BP 130/88 | HR 105 | Ht 65.0 in | Wt 198.0 lb

## 2016-08-07 DIAGNOSIS — N72 Inflammatory disease of cervix uteri: Secondary | ICD-10-CM

## 2016-08-07 DIAGNOSIS — N889 Noninflammatory disorder of cervix uteri, unspecified: Secondary | ICD-10-CM

## 2016-08-07 DIAGNOSIS — B977 Papillomavirus as the cause of diseases classified elsewhere: Secondary | ICD-10-CM

## 2016-08-07 NOTE — Progress Notes (Signed)
GYNECOLOGY CLINIC COLPOSCOPY PROCEDURE NOTE  36 y.o. G2P2 here for colposcopy for peristent HR HPV pap smear on 1 year and 1 month ago. Discussed underlying role for HPV infection in the development of cervical dysplasia, its natural history and progression/regression, need for surveillance.  Is the patient  pregnant: No LMP: Patient's last menstrual period was 08/01/2016 (exact date). Smoking status:  Metrics: Intervention Frequency ACO  Documented Smoking Status Yearly  Screened one or more times in 24 months  Cessation Counseling or  Active cessation medication Past 24 months  Past 24 months   Guideline developer: UpToDate (See UpToDate for funding source) Date Released: 2014   Contraception: BTL High risk partner: No.   History of STD:  No. Future fertility desired:  No.  Patient given informed consent, signed copy in the chart, time out was performed.  The patient was position in dorsal lithotomy position. Speculum was placed the cervix was visualized.   After application of acetic acid colposcopic inspection of the cervix was undertaken. The patient reports that prior provides have had problems finding her cervix.  The uterus appears to be deflected to the patient's left, but cervix is able to be brought into view without other difficulty.  Colposcopy adequate, full visualization of transformation zone: Yes no visible lesions; corresponding biopsies obtained.   ECC specimen obtained:  Yes All specimens were labeled and sent to pathology.   Patient was given post procedure instructions.  Will follow up pathology and manage accordingly.  Routine preventative health maintenance measures emphasized.  Physical Exam  Genitourinary:      Will order ultrasound to evaluate for fibroids, does report heavy menses.  DDx for deflection of uterus also include ovarian or pelvic masses, adhesive disease.  Malachy Mood, MD, FACOG [facility], [facility] No lesions  6 O'Clock biopsy and ECC [place] for cervical deviation to left no surgery other than IUD removal and BTL Heavy periods

## 2016-08-11 LAB — PATHOLOGY

## 2016-08-28 ENCOUNTER — Ambulatory Visit: Payer: PRIVATE HEALTH INSURANCE | Admitting: Obstetrics and Gynecology

## 2016-08-28 ENCOUNTER — Other Ambulatory Visit: Payer: PRIVATE HEALTH INSURANCE

## 2016-09-18 ENCOUNTER — Ambulatory Visit (INDEPENDENT_AMBULATORY_CARE_PROVIDER_SITE_OTHER): Payer: Self-pay | Admitting: Obstetrics and Gynecology

## 2016-09-18 ENCOUNTER — Encounter: Payer: Self-pay | Admitting: Obstetrics and Gynecology

## 2016-09-18 ENCOUNTER — Ambulatory Visit (INDEPENDENT_AMBULATORY_CARE_PROVIDER_SITE_OTHER): Payer: Self-pay

## 2016-09-18 VITALS — BP 126/80 | HR 105 | Ht 64.5 in | Wt 198.0 lb

## 2016-09-18 DIAGNOSIS — N889 Noninflammatory disorder of cervix uteri, unspecified: Secondary | ICD-10-CM

## 2016-09-18 DIAGNOSIS — N800 Endometriosis of uterus: Secondary | ICD-10-CM

## 2016-09-18 DIAGNOSIS — N809 Endometriosis, unspecified: Principal | ICD-10-CM

## 2016-09-18 DIAGNOSIS — N8003 Adenomyosis of the uterus: Secondary | ICD-10-CM

## 2016-09-18 DIAGNOSIS — N920 Excessive and frequent menstruation with regular cycle: Secondary | ICD-10-CM

## 2016-09-18 MED ORDER — NORETHINDRONE ACETATE 5 MG PO TABS: 5.0000 mg | ORAL_TABLET | Freq: Every day | ORAL | 11 refills | Status: DC

## 2016-09-19 NOTE — Progress Notes (Signed)
  Gynecology Ultrasound Follow Up  Chief Complaint:  Chief Complaint  Patient presents with  . U/S follow up    GYN U/S     History of Present Illness: Patient is a 36 y.o. female who presents today for ultrasound evaluation of menorrhagia, possible right ovarian cyst.  Ultrasound demonstrates the following findgins Adnexa: no masses seen normal consistency Uterus: Non-enlarged with endometrial stripe indistinct endometrial/myometrial interface 7.27mm Additional: no free fluid  Overall appearance consistent with adenomyosis  Review of Systems: Review of Systems  Constitutional: Negative for chills and fever.  Gastrointestinal: Negative for abdominal pain.    Past Medical History:  Past Medical History:  Diagnosis Date  . Anemia    H/O AS A CHILD  . BRCA negative 08-17-15   MyRisk  . GERD (gastroesophageal reflux disease)    OCC-NO MEDS  . Headache    MIGRAINES  . History of kidney stones     Past Surgical History:  Past Surgical History:  Procedure Laterality Date  . BREAST BIOPSY Right 07-23-15   BENIGN BREAST TISSUE WITH INTRADUCTAL PAPILLOMA AND COLUMNAR CELL   . BREAST BIOPSY Left 2017   NEG  . BREAST EXCISIONAL BIOPSY Right 2017   NEG  . BREAST LUMPECTOMY Right 01/11/2016   Procedure: BREAST LUMPECTOMY;  Surgeon: Seeplaputhur G Sankar, MD;  Location: [facility] ORS;  Service: General;  Laterality: Right;  . TUBAL LIGATION    . WISDOM TOOTH EXTRACTION      Gynecologic History:  Patient's last menstrual period was 08/27/2016. Contraception: tubal ligation Last Pap: 07/07/16 Results were: .NIL HPV positive, preceding pap 07/25/2015 HPV positive as well.  Colposcopy 08/07/16 negative for dysplasia  Family History:  Family History  Problem Relation Age of Onset  . Breast cancer Maternal Aunt        4 mat. aunts 30's and 40's  . Breast cancer Maternal Grandmother   . Breast cancer Cousin        5 maternal cousins    Social History:  Social History   Social  History  . Marital status: Married    Spouse name: N/A  . Number of children: N/A  . Years of education: N/A   Occupational History  . Not on file.   Social History Main Topics  . Smoking status: Current Every Day Smoker    Packs/day: 0.50    Years: 17.00    Types: Cigarettes  . Smokeless tobacco: Never Used  . Alcohol use No  . Drug use: No  . Sexual activity: Yes    Birth control/ protection: None   Other Topics Concern  . Not on file   Social History Narrative  . No narrative on file    Allergies:  Allergies  Allergen Reactions  . Penicillins Anaphylaxis  . Sulfa Antibiotics Other (See Comments)    SORES IN MOUTH AND THROAT    Medications: Prior to Admission medications   Medication Sig Start Date End Date Taking? Authorizing Provider  acetaminophen (TYLENOL) 325 MG tablet Take 650 mg by mouth every 6 (six) hours as needed for moderate pain.   Yes [provider]  aspirin-acetaminophen-caffeine (EXCEDRIN MIGRAINE) 250-250-65 MG tablet Take 2 tablets by mouth 2 (two) times daily as needed for headache.   Yes [provider]  Melatonin 10 MG TABS Take 1 tablet by mouth at bedtime.   Yes [provider]  norethindrone (AYGESTIN) 5 MG tablet Take 1 tablet (5 mg total) by mouth daily. 09/18/16   Staebler, Andreas, MD      Physical Exam Vitals: Blood pressure 126/80, pulse (!) 105, height 5' 4.5" (1.638 m), weight 198 lb (89.8 kg), last menstrual period 08/27/2016.  General: NAD HEENT: normocephalic, anicteric Pulmonary: No increased work of breathing Extremities: no edema, erythema, or tenderness Neurologic: Grossly intact, normal gait Psychiatric: mood appropriate, affect full   Assessment: 36 y.o. G2P2 menorrhagia, adenomyosis Plan: Problem List Items Addressed This Visit    None    Visit Diagnoses    Adenomyosis    -  Primary   Menorrhagia with regular cycle          1) Discussed management option for adenomyosis including  expectant management, hormonal management including IUD (patient had to have prior IUD surgically removed so this is not an option she would wish to pursue), ablation (with high likelihood of long term failure give age and adenomyosis), as well as hysterectomy.  Given pros and cons of each management option the patient opts to proceed with hormonal management via norethindrone at this time.  2) A total of 15 minutes were spent in face-to-face contact with the patient during this encounter with over half of that time devoted to counseling and coordination of care.

## 2016-09-22 ENCOUNTER — Emergency Department
Admission: EM | Admit: 2016-09-22 | Discharge: 2016-09-22 | Disposition: A | Discharge status: Home / Self Care | Payer: Self-pay | Attending: Emergency Medicine | Admitting: Emergency Medicine

## 2016-09-22 DIAGNOSIS — Z9889 Other specified postprocedural states: Secondary | ICD-10-CM | POA: Insufficient documentation

## 2016-09-22 DIAGNOSIS — F17210 Nicotine dependence, cigarettes, uncomplicated: Secondary | ICD-10-CM | POA: Insufficient documentation

## 2016-09-22 DIAGNOSIS — Z79899 Other long term (current) drug therapy: Secondary | ICD-10-CM | POA: Insufficient documentation

## 2016-09-22 DIAGNOSIS — L0501 Pilonidal cyst with abscess: Secondary | ICD-10-CM | POA: Insufficient documentation

## 2016-09-22 MED ORDER — DOXYCYCLINE HYCLATE 100 MG PO TABS
100.0000 mg | ORAL_TABLET | Freq: Once | ORAL | Status: AC
  Administered 2016-09-22: 100 mg via ORAL
  Filled 2016-09-22: qty 1

## 2016-09-22 MED ORDER — LIDOCAINE HCL (PF) 1 % IJ SOLN
10.0000 mL | Freq: Once | INTRAMUSCULAR | Status: AC
  Administered 2016-09-22: 10 mL
  Filled 2016-09-22: qty 10

## 2016-09-22 MED ORDER — OXYCODONE-ACETAMINOPHEN 5-325 MG PO TABS: 1.0000 | ORAL_TABLET | Freq: Three times a day (TID) | ORAL | 0 refills | Status: DC | PRN

## 2016-09-22 MED ORDER — OXYCODONE-ACETAMINOPHEN 5-325 MG PO TABS
1.0000 | ORAL_TABLET | Freq: Once | ORAL | Status: AC
  Administered 2016-09-22: 1 via ORAL
  Filled 2016-09-22: qty 1

## 2016-09-22 MED ORDER — DOXYCYCLINE HYCLATE 100 MG PO TABS: 100.0000 mg | ORAL_TABLET | Freq: Two times a day (BID) | ORAL | 0 refills | Status: DC

## 2016-09-22 NOTE — ED Triage Notes (Signed)
Abscess to top of buttocks X 3 days, worse when sitting down on it and applying pressure to it.

## 2016-09-22 NOTE — ED Provider Notes (Signed)
[facility] Emergency Department Provider Note ____________________________________________  Time seen: 1839  I have reviewed the triage vital signs and the nursing notes.  HISTORY  Chief Complaint  Abscess  HPI Angie Weber is a 36 y.o. female presents to the ED for evaluation of abscess of top of about the last 3 days. Patient describes symptoms are worsened with attempts to sit down and put pressure on it. She denies any spontaneous drainage. She has been applying warm compresses in the interim. She does similar episode about 2 years prior, but reports spontaneous drainage and no clinical intervention. She denies any flank fevers, but reports chillsand fatigue. She dosed ibuprofen this afternoon for pain relief.   Past Medical History:  Diagnosis Date  . Anemia    H/O AS A CHILD  . BRCA negative 08-17-15   MyRisk  . GERD (gastroesophageal reflux disease)    OCC-NO MEDS  . Headache    MIGRAINES  . History of kidney stones     There are no active problems to display for this patient.   Past Surgical History:  Procedure Laterality Date  . BREAST BIOPSY Right 07-23-15   BENIGN BREAST TISSUE WITH INTRADUCTAL PAPILLOMA AND COLUMNAR CELL   . BREAST BIOPSY Left 2017   NEG  . BREAST EXCISIONAL BIOPSY Right 2017   NEG  . BREAST LUMPECTOMY Right 01/11/2016   Procedure: BREAST LUMPECTOMY;  Surgeon: Christene Lye, MD;  Location: [facility] ORS;  Service: General;  Laterality: Right;  . TUBAL LIGATION    . WISDOM TOOTH EXTRACTION      Prior to Admission medications   Medication Sig Start Date End Date Taking? Authorizing Provider  acetaminophen (TYLENOL) 325 MG tablet Take 650 mg by mouth every 6 (six) hours as needed for moderate pain.    [provider]  aspirin-acetaminophen-caffeine (EXCEDRIN MIGRAINE) [phone] MG tablet Take 2 tablets by mouth 2 (two) times daily as needed for headache.    [provider]  doxycycline (VIBRA-TABS)  100 MG tablet Take 1 tablet (100 mg total) by mouth 2 (two) times daily. 09/22/16   Niva Murren, Dannielle Karvonen, PA-C  Melatonin 10 MG TABS Take 1 tablet by mouth at bedtime.    [provider]  norethindrone (AYGESTIN) 5 MG tablet Take 1 tablet (5 mg total) by mouth daily. 09/18/16   Malachy Mood, MD  oxyCODONE-acetaminophen (ROXICET) 5-325 MG tablet Take 1 tablet by mouth every 8 (eight) hours as needed. 09/22/16   Syrah Daughtrey, Dannielle Karvonen, PA-C    Allergies Penicillins and Sulfa antibiotics  Family History  Problem Relation Age of Onset  . Breast cancer Maternal Aunt        4 mat. aunts 83's and 41's  . Breast cancer Maternal Grandmother   . Breast cancer Cousin        5 maternal cousins    Social History Social History  Substance Use Topics  . Smoking status: Current Every Day Smoker    Packs/day: 0.50    Years: 17.00    Types: Cigarettes  . Smokeless tobacco: Never Used  . Alcohol use No    Review of Systems  Constitutional: Negative for fever. Cardiovascular: Negative for chest pain. Respiratory: Negative for shortness of breath. Gastrointestinal: Negative for abdominal pain, vomiting and diarrhea. Genitourinary: Negative for dysuria. Musculoskeletal: Negative for back pain. Skin: Negative for rash. Gluteal cleft abscess as above. Neurological: Negative for headaches, focal weakness or numbness. ____________________________________________  PHYSICAL EXAM:  VITAL SIGNS: ED Triage Vitals [  09/22/16 1812]  Enc Vitals Group     BP (!) 152/94     Pulse Rate 90     Resp 18     Temp 98.5 F (36.9 C)     Temp Source Oral     SpO2 100 %     Weight 193 lb (87.5 kg)     Height _0  (1.626 m)     Head Circumference      Peak Flow      Pain Score 6     Pain Loc      Pain Edu?      Excl. in [place]?     Constitutional: Alert and oriented. Well appearing and in no distress. Head: Normocephalic and atraumatic. Cardiovascular: Normal rate, regular rhythm. Normal  distal pulses. Respiratory: Normal respiratory effort. No wheezes/rales/rhonchi. Gastrointestinal: Soft and nontender. No distention. Musculoskeletal: Nontender with normal range of motion in all extremities.  Neurologic:  Normal gait without ataxia. Normal speech and language. No gross focal neurologic deficits are appreciated. Skin:  Skin is warm, dry and intact. No rash noted. Gluteal cleft with a pointing abscess formation at the proximal end. Tenderness and induration if palpable deep to the epidermis.  Psychiatric: Mood and affect are normal. Patient exhibits appropriate insight and judgment. ____________________________________________  PROCEDURES  Percocet 5-325 mg PO Doxycycline 100 mg PO  INCISION AND DRAINAGE Performed by: Melvenia Needles Consent: Verbal consent obtained. Risks and benefits: risks, benefits and alternatives were discussed Type: abscess  Body area: gluteal cleft  Anesthesia: local infiltration  Incision was made with a scalpel.  Local anesthetic: lidocaine 1% w/o epinephrine  Anesthetic total: 8 ml  Complexity: complex Blunt dissection to break up loculations  Drainage: purulent  Drainage amount: moderate   Packing material: 1/4 in iodoform gauze  Patient tolerance: Patient tolerated the procedure well with no immediate complications. ____________________________________________  INITIAL IMPRESSION / ASSESSMENT AND PLAN / ED COURSE  Patient status post I&D procedure for a pilonidal cyst abscess. Patient tolerated procedure well and is discharged with wound care instructions. She'll return to ED in 2-3 days for wound check and packing removal. She is discharged with prescription for doxycycline and Percocet dose for intermittent pain relief. Return precautions are reviewed. ____________________________________________  FINAL CLINICAL IMPRESSION(S) / ED DIAGNOSES  Final diagnoses:  Pilonidal abscess  Status post incision and  drainage      Isamar Wellbrock, Dannielle Karvonen, PA-C 09/22/16 2138    Rudene Re, MD 09/23/16 2111

## 2016-09-22 NOTE — ED Notes (Signed)
See triage note  Possible abscess area to coccyx area   Noticed area bout 3 days ago  Pain is worse today and when she sits

## 2016-09-22 NOTE — Discharge Instructions (Signed)
Keep the wound clean, dry, and covered. You may apply warm compresses over the dressing (warm washcloth in a sandwich bag) to promote healing. Return to the ED in 3 days for wound check and packing removal. Take the antibiotic as directed and the pain medicine as needed.

## 2016-09-25 ENCOUNTER — Encounter: Payer: Self-pay | Admitting: Emergency Medicine

## 2016-09-25 ENCOUNTER — Emergency Department
Admission: EM | Admit: 2016-09-25 | Discharge: 2016-09-25 | Disposition: A | Discharge status: Home / Self Care | Payer: Self-pay | Attending: Emergency Medicine | Admitting: Emergency Medicine

## 2016-09-25 DIAGNOSIS — Z09 Encounter for follow-up examination after completed treatment for conditions other than malignant neoplasm: Secondary | ICD-10-CM

## 2016-09-25 DIAGNOSIS — Z4800 Encounter for change or removal of nonsurgical wound dressing: Secondary | ICD-10-CM | POA: Insufficient documentation

## 2016-09-25 NOTE — ED Triage Notes (Signed)
Wound check , to buttocks with packing, was told to return for recheck today

## 2016-09-25 NOTE — ED Provider Notes (Signed)
[facility] Emergency Department Provider Note ____________________________________________  Time seen: 1712  I have reviewed the triage vital signs and the nursing notes.  HISTORY  Chief Complaint  Wound Check  HPI Angie Weber is a 36 y.o. female presents to the ED for recheck of a pilonidal abscess that was incised and drained. She denies any interim complaints, and tolerated the antibiotic as well. She has taken any pain medicine in 2 days.  Past Medical History:  Diagnosis Date  . Anemia    H/O AS A CHILD  . BRCA negative 08-17-15   MyRisk  . GERD (gastroesophageal reflux disease)    OCC-NO MEDS  . Headache    MIGRAINES  . History of kidney stones     There are no active problems to display for this patient.   Past Surgical History:  Procedure Laterality Date  . BREAST BIOPSY Right 07-23-15   BENIGN BREAST TISSUE WITH INTRADUCTAL PAPILLOMA AND COLUMNAR CELL   . BREAST BIOPSY Left 2017   NEG  . BREAST EXCISIONAL BIOPSY Right 2017   NEG  . BREAST LUMPECTOMY Right 01/11/2016   Procedure: BREAST LUMPECTOMY;  Surgeon: Christene Lye, MD;  Location: [facility] ORS;  Service: General;  Laterality: Right;  . TUBAL LIGATION    . WISDOM TOOTH EXTRACTION      Prior to Admission medications   Medication Sig Start Date End Date Taking? Authorizing Provider  acetaminophen (TYLENOL) 325 MG tablet Take 650 mg by mouth every 6 (six) hours as needed for moderate pain.    [provider]  aspirin-acetaminophen-caffeine (EXCEDRIN MIGRAINE) [phone] MG tablet Take 2 tablets by mouth 2 (two) times daily as needed for headache.    [provider]  doxycycline (VIBRA-TABS) 100 MG tablet Take 1 tablet (100 mg total) by mouth 2 (two) times daily. 09/22/16   Mylik Pro, Dannielle Karvonen, PA-C  Melatonin 10 MG TABS Take 1 tablet by mouth at bedtime.    [provider]  norethindrone (AYGESTIN) 5 MG tablet Take 1 tablet (5 mg total) by mouth  daily. 09/18/16   Malachy Mood, MD  oxyCODONE-acetaminophen (ROXICET) 5-325 MG tablet Take 1 tablet by mouth every 8 (eight) hours as needed. 09/22/16   Krishan Mcbreen, Dannielle Karvonen, PA-C   Allergies Penicillins and Sulfa antibiotics  Family History  Problem Relation Age of Onset  . Breast cancer Maternal Aunt        4 mat. aunts 31's and 11's  . Breast cancer Maternal Grandmother   . Breast cancer Cousin        5 maternal cousins    Social History Social History  Substance Use Topics  . Smoking status: Current Every Day Smoker    Packs/day: 0.50    Years: 17.00    Types: Cigarettes  . Smokeless tobacco: Never Used  . Alcohol use No    Review of Systems  Constitutional: Negative for fever. Cardiovascular: Negative for chest pain. Respiratory: Negative for shortness of breath. Gastrointestinal: Negative for abdominal pain, vomiting and diarrhea. Skin: Negative for rash. Pilonidal abscess as above.  Neurological: Negative for headaches, focal weakness or numbness. ____________________________________________  PHYSICAL EXAM:  VITAL SIGNS: ED Triage Vitals [09/25/16 1706]  Enc Vitals Group     BP (!) 148/77     Pulse Rate 94     Resp 18     Temp 98.4 F (36.9 C)     Temp Source Oral     SpO2 100 %  Weight 193 lb (87.5 kg)     Height '5\' 4"'$  (1.626 m)     Head Circumference      Peak Flow      Pain Score 2     Pain Loc      Pain Edu?      Excl. in [place]?     Constitutional: Alert and oriented. Well appearing and in no distress. Head: Normocephalic and atraumatic. Cardiovascular: Normal rate, regular rhythm. Normal distal pulses. Respiratory: Normal respiratory effort.  Musculoskeletal: Nontender with normal range of motion in all extremities.  Neurologic:  Normal gait without ataxia. Normal speech and language. No gross focal neurologic deficits are appreciated. Skin:  Skin is warm, dry and intact. No rash noted. Patient with a well-healing pilonidal cyst  abscess. Packing is removed revealing some adherent purulent drainage. The wound was however/and the saline runs clear. The indurated wound is smaller than previously encountered. And there are no signs of worsening infection. ____________________________________________  PROCEDURES  Wound packing removed Wound flushed with 10 cc saline Wound packed with iodoform packing Dry dressing applied ____________________________________________  INITIAL IMPRESSION / ASSESSMENT AND PLAN / ED COURSE  Patient was ED evaluation and wound check status post I&D procedure of a polyposis. Wound is healing well and there is minimal purulent drainage noted. Patient's wound was however repacked due to the large abscess that was cleared 3 days prior. She will continue dosing advice as prescribed and return to the ED as needed for interim wound check and packing removal in 2-3 days. She verbalized confidence in that she maybe able to remove the packing on her own. Return to ED as needed. ____________________________________________  FINAL CLINICAL IMPRESSION(S) / ED DIAGNOSES  Final diagnoses:  Encounter for recheck of abscess following incision and drainage      Carmie End, Dannielle Karvonen, PA-C 09/25/16 1933    Schuyler Amor, MD 09/25/16 2031

## 2016-09-25 NOTE — Discharge Instructions (Signed)
Keep the wound clean and covered. Remove the packing in 3 days. Continue to antibiotic as prescribed.

## 2016-10-01 ENCOUNTER — Encounter: Payer: Self-pay | Admitting: *Deleted

## 2016-10-01 NOTE — Progress Notes (Addendum)
Talked to Dr. Limmie Patricia today in regards to recommendations for the patients next pap smear.  She has had 2 negative / HPV positive pap smears in the last 2 years.  Colposcopy and biopsy 2018 was benign.  He has recommended that she follow up in one year for pap smear and HPV co-testing.  Letter mailed to inform patient of her next appointment on 07/05/17 @ 11:00.  [facility] to [place].

## 2016-12-22 ENCOUNTER — Encounter: Payer: Self-pay | Admitting: Obstetrics and Gynecology

## 2016-12-22 ENCOUNTER — Ambulatory Visit (INDEPENDENT_AMBULATORY_CARE_PROVIDER_SITE_OTHER): Payer: Self-pay | Admitting: Obstetrics and Gynecology

## 2016-12-22 VITALS — BP 110/84 | HR 81 | Ht 64.0 in | Wt 211.0 lb

## 2016-12-22 DIAGNOSIS — N809 Endometriosis, unspecified: Principal | ICD-10-CM

## 2016-12-22 DIAGNOSIS — N800 Endometriosis of uterus: Secondary | ICD-10-CM

## 2016-12-22 DIAGNOSIS — N8003 Adenomyosis of the uterus: Secondary | ICD-10-CM

## 2016-12-22 MED ORDER — NORETHINDRONE ACETATE 5 MG PO TABS: 5.0000 mg | ORAL_TABLET | Freq: Every day | ORAL | 11 refills | Status: DC

## 2016-12-22 NOTE — Progress Notes (Signed)
Obstetrics & Gynecology Office Visit   Chief Complaint:  Chief Complaint  Patient presents with  . Follow-up    Medication     History of Present Illness: 36 year old with suspected adenomyosis based on heavy menstrual cycles and dysmenorrhea and ultrasound appearance 09/18/16 who presents for follow up after starting norethindrone.  She had previously failed a trial of Mirena IUD.  At the last visit we discussed management option going forward and she opted for a trial of norethindrone.  The patient reports that she had almost daily bleeding for the first 3 weeks but since then has achieved amenorrhea for the past 3 months.  She is very happy with current symptoms, is not experiencing any irregular bleeding or pain.  She has gained about 5lbs in the past 3 months but finds this to be an acceptable trade-off.  Review of Systems: Review of Systems  Constitutional: Negative for weight loss.  Gastrointestinal: Negative for abdominal pain.  Genitourinary: Negative for dysuria.     Past Medical History:  Past Medical History:  Diagnosis Date  . Anemia    H/O AS A CHILD  . BRCA negative 08-17-15   MyRisk  . GERD (gastroesophageal reflux disease)    OCC-NO MEDS  . Headache    MIGRAINES  . History of kidney stones     Past Surgical History:  Past Surgical History:  Procedure Laterality Date  . BREAST BIOPSY Right 07-23-15   BENIGN BREAST TISSUE WITH INTRADUCTAL PAPILLOMA AND COLUMNAR CELL   . BREAST BIOPSY Left 2017   NEG  . BREAST EXCISIONAL BIOPSY Right 2017   NEG  . BREAST LUMPECTOMY Right 01/11/2016   Procedure: BREAST LUMPECTOMY;  Surgeon: Christene Lye, MD;  Location: [facility] ORS;  Service: General;  Laterality: Right;  . TUBAL LIGATION    . WISDOM TOOTH EXTRACTION      Gynecologic History: Patient's last menstrual period was 10/13/2016 (exact date).  Obstetric History: G2P2  Family History:  Family History  Problem Relation Age of Onset  . Breast  cancer Maternal Aunt        4 mat. aunts 50's and 67's  . Breast cancer Maternal Grandmother   . Breast cancer Cousin        5 maternal cousins    Social History:  Social History   Social History  . Marital status: Married    Spouse name: N/A  . Number of children: N/A  . Years of education: N/A   Occupational History  . Not on file.   Social History Main Topics  . Smoking status: Current Every Day Smoker    Packs/day: 0.50    Years: 17.00    Types: Cigarettes  . Smokeless tobacco: Never Used  . Alcohol use No  . Drug use: No  . Sexual activity: Yes    Birth control/ protection: None   Other Topics Concern  . Not on file   Social History Narrative  . No narrative on file    Allergies:  Allergies  Allergen Reactions  . Penicillins Anaphylaxis  . Sulfa Antibiotics Other (See Comments)    SORES IN MOUTH AND THROAT    Medications: Prior to Admission medications   Medication Sig Start Date End Date Taking? Authorizing Provider  acetaminophen (TYLENOL) 325 MG tablet Take 650 mg by mouth every 6 (six) hours as needed for moderate pain.   Yes [provider]  aspirin-acetaminophen-caffeine (EXCEDRIN MIGRAINE) [phone] MG tablet Take 2 tablets by mouth 2 (  two) times daily as needed for headache.   Yes [provider]  Melatonin 10 MG TABS Take 1 tablet by mouth at bedtime.   Yes [provider]  norethindrone (AYGESTIN) 5 MG tablet Take 1 tablet (5 mg total) by mouth daily. 09/18/16  Yes Malachy Mood, MD  doxycycline (VIBRA-TABS) 100 MG tablet Take 1 tablet (100 mg total) by mouth 2 (two) times daily. Patient not taking: Reported on 12/22/2016 09/22/16   Menshew, Dannielle Karvonen, PA-C  oxyCODONE-acetaminophen (ROXICET) 5-325 MG tablet Take 1 tablet by mouth every 8 (eight) hours as needed. Patient not taking: Reported on 12/22/2016 09/22/16   Menshew, Dannielle Karvonen, PA-C    Physical Exam Blood pressure 110/84, pulse 81, height _0   (1.626 m), weight 211 lb (95.7 kg), last menstrual period 10/13/2016.  General: NAD HEENT: normocephalic, anicteric Pulmonary: No increased work of breathing Neurologic: Grossly intact Psychiatric: mood appropriate, affect full  Female chaperone present for pelvic and breast  portions of the physical exam  Assessment: 36 y.o. G2P2 medication follow up  Plan: Problem List Items Addressed This Visit    None    Visit Diagnoses    Adenomyosis    -  Primary     - Continue norethindrone, I discussed with patient the possibility of stepping down to 2.47m if continued good control but weight gain.  At present she would like to stay at the 527mdose.  We discussed that ultrasound was most consistent with adenomyosis but a component of endometriosis also remains in the differential.  Given response of symptoms will continue norethindrone without any further interventions or work up at this time.  I did discuss repeat trial of Mirena if patient continues to do well but given prior Mirena had to be surgically removed she is understandably leary about this option.  Return in about 1 year (around 12/22/2017) for annual.

## 2017-01-23 ENCOUNTER — Emergency Department
Admission: EM | Admit: 2017-01-23 | Discharge: 2017-01-23 | Disposition: A | Discharge status: Home / Self Care | Payer: Self-pay | Attending: Emergency Medicine | Admitting: Emergency Medicine

## 2017-01-23 ENCOUNTER — Encounter: Payer: Self-pay | Admitting: Emergency Medicine

## 2017-01-23 ENCOUNTER — Other Ambulatory Visit: Payer: Self-pay

## 2017-01-23 DIAGNOSIS — L0501 Pilonidal cyst with abscess: Secondary | ICD-10-CM | POA: Insufficient documentation

## 2017-01-23 DIAGNOSIS — F17210 Nicotine dependence, cigarettes, uncomplicated: Secondary | ICD-10-CM | POA: Insufficient documentation

## 2017-01-23 MED ORDER — METRONIDAZOLE 500 MG PO TABS: 500.0000 mg | ORAL_TABLET | Freq: Two times a day (BID) | ORAL | 0 refills | Status: DC

## 2017-01-23 MED ORDER — OXYCODONE-ACETAMINOPHEN 5-325 MG PO TABS
1.0000 | ORAL_TABLET | Freq: Once | ORAL | Status: AC
  Administered 2017-01-23: 1 via ORAL
  Filled 2017-01-23: qty 1

## 2017-01-23 MED ORDER — OXYCODONE-ACETAMINOPHEN 5-325 MG PO TABS: 1.0000 | ORAL_TABLET | Freq: Four times a day (QID) | ORAL | 0 refills | Status: DC | PRN

## 2017-01-23 MED ORDER — CIPROFLOXACIN HCL 500 MG PO TABS: 500.0000 mg | ORAL_TABLET | Freq: Two times a day (BID) | ORAL | 0 refills | Status: DC

## 2017-01-23 MED ORDER — LIDOCAINE HCL (PF) 1 % IJ SOLN
5.0000 mL | Freq: Once | INTRAMUSCULAR | Status: AC
  Administered 2017-01-23: 5 mL
  Filled 2017-01-23: qty 5

## 2017-01-23 NOTE — ED Provider Notes (Signed)
[facility] Emergency Department Provider Note   ____________________________________________   First MD Initiated Contact with Patient 01/23/17 1258     (approximate)  I have reviewed the triage vital signs and the nursing notes.   HISTORY  Chief Complaint Abscess   HPI Angie Weber is a 36 y.o. female is here with complaint of piadonal cyst. Patient states that over the last week is been getting worse. She states that during the last week she is done to sitz baths which seems to be making it worse.she denies any fever or chills. There's been no nausea or vomiting. Patient states that she has a history of the same.  Currently she rates her pain as 6 out of 10.   Past Medical History:  Diagnosis Date  . Anemia    H/O AS A CHILD  . BRCA negative 08-17-15   MyRisk  . GERD (gastroesophageal reflux disease)    OCC-NO MEDS  . Headache    MIGRAINES  . History of kidney stones     There are no active problems to display for this patient.   Past Surgical History:  Procedure Laterality Date  . BREAST BIOPSY Right 07-23-15   BENIGN BREAST TISSUE WITH INTRADUCTAL PAPILLOMA AND COLUMNAR CELL   . BREAST BIOPSY Left 2017   NEG  . BREAST EXCISIONAL BIOPSY Right 2017   NEG  . BREAST LUMPECTOMY Right 01/11/2016   Procedure: BREAST LUMPECTOMY;  Surgeon: Christene Lye, MD;  Location: [facility] ORS;  Service: General;  Laterality: Right;  . TUBAL LIGATION    . WISDOM TOOTH EXTRACTION      Prior to Admission medications   Medication Sig Start Date End Date Taking? Authorizing Provider  acetaminophen (TYLENOL) 325 MG tablet Take 650 mg by mouth every 6 (six) hours as needed for moderate pain.    [provider]  aspirin-acetaminophen-caffeine (EXCEDRIN MIGRAINE) [phone] MG tablet Take 2 tablets by mouth 2 (two) times daily as needed for headache.    [provider]  ciprofloxacin (CIPRO) 500 MG tablet Take 1 tablet (500 mg total) by  mouth 2 (two) times daily. 01/23/17   Johnn Hai, PA-C  Melatonin 10 MG TABS Take 1 tablet by mouth at bedtime.    [provider]  metroNIDAZOLE (FLAGYL) 500 MG tablet Take 1 tablet (500 mg total) by mouth 2 (two) times daily. 01/23/17   Johnn Hai, PA-C  norethindrone (AYGESTIN) 5 MG tablet Take 1 tablet (5 mg total) by mouth daily. 12/22/16   Malachy Mood, MD  oxyCODONE-acetaminophen (PERCOCET) 5-325 MG tablet Take 1 tablet by mouth every 6 (six) hours as needed for moderate pain. 01/23/17   Johnn Hai, PA-C    Allergies Penicillins and Sulfa antibiotics  Family History  Problem Relation Age of Onset  . Breast cancer Maternal Aunt        4 mat. aunts 39's and 65's  . Breast cancer Maternal Grandmother   . Breast cancer Cousin        5 maternal cousins    Social History Social History   Tobacco Use  . Smoking status: Current Every Day Smoker    Packs/day: 0.50    Years: 17.00    Pack years: 8.50    Types: Cigarettes  . Smokeless tobacco: Never Used  Substance Use Topics  . Alcohol use: No    Alcohol/week: 0.0 oz  . Drug use: No    Review of Systems Constitutional: No fever/chills Cardiovascular: Denies chest  pain. Respiratory: Denies shortness of breath. Musculoskeletal: Negative for back pain. Skin: positive for abscesses. Neurological: Negative for headaches ____________________________________________   PHYSICAL EXAM:  VITAL SIGNS: ED Triage Vitals  Enc Vitals Group     BP 01/23/17 1248 (!) 134/94     Pulse Rate 01/23/17 1248 74     Resp 01/23/17 1248 16     Temp 01/23/17 1248 98.4 F (36.9 C)     Temp Source 01/23/17 1248 Oral     SpO2 01/23/17 1248 98 %     Weight 01/23/17 1247 198 lb (89.8 kg)     Height 01/23/17 1247 '5\' 4"'$  (1.626 m)     Head Circumference --      Peak Flow --      Pain Score 01/23/17 1247 6     Pain Loc --      Pain Edu? --      Excl. in [place]? --    Constitutional: Alert and oriented. Well  appearing and in no acute distress. Eyes: Conjunctivae are normal.  Head: Atraumatic. Neck: No stridor.   Cardiovascular: Normal rate, regular rhythm. Grossly normal heart sounds.  Good peripheral circulation. Respiratory: Normal respiratory effort.  No retractions. Lungs CTAB. Musculoskeletal: oves upper and lower extremities without difficulty and normal gait was noted. Neurologic:  Normal speech and language. No gross focal neurologic deficits are appreciated.  Skin:  Skin is warm, dry and intact. pilonidal abscess present on exam. There is a small extending cellulitis measuring approximately 2 cm around this area. Area is markedly tender to palpation. No active drainage seen. Psychiatric: Mood and affect are normal. Speech and behavior are normal.  ____________________________________________   LABS (all labs ordered are listed, but only abnormal results are displayed)  Labs Reviewed - No data to display _______________________________________   PROCEDURES  Procedure(s) performed:   INCISION AND DRAINAGE Performed by: Johnn Hai Consent: Verbal consent obtained. Risks and benefits: risks, benefits and alternatives were discussed Type: abscess  Body area: pilonidal abscess  Anesthesia: local infiltration  Incision was made with a scalpel.  Local anesthetic: lidocaine 1% without epinephrine  Anesthetic total: 3.0 ml  Complexity: complex Blunt dissection to break up loculations  Drainage: purulent  Drainage amount: large  Packing material: 1/4 in iodoform gauze  Patient tolerance: Patient tolerated the procedure well with no immediate complications.    Procedures  Critical Care performed: No  ____________________________________________   INITIAL IMPRESSION / ASSESSMENT AND PLAN / ED COURSE Patient was given Percocet prior to procedure. Patient has a driver as her husband is out in the lobby. Moderate amount of drainage after incision. Area was packed  and patient is aware that she needs to return in 2 days for removal. Patient was started on Flagyl 500 mg twice a day and Cipro 500 mg twice a day. She was given a prescription for Percocet 1 every 6 hours as needed for pain. We discussed follow-up with a surgeon in the event that she decides to have it surgically removed. ____________________________________________   FINAL CLINICAL IMPRESSION(S) / ED DIAGNOSES  Final diagnoses:  Pilonidal abscess     ED Discharge Orders        Ordered    metroNIDAZOLE (FLAGYL) 500 MG tablet  2 times daily     01/23/17 1438    ciprofloxacin (CIPRO) 500 MG tablet  2 times daily     01/23/17 1438    oxyCODONE-acetaminophen (PERCOCET) 5-325 MG tablet  Every 6 hours PRN     01/23/17  1438       Note:  This document was prepared using Dragon voice recognition software and may include unintentional dictation errors.    Johnn Hai, PA-C 01/23/17 1628    Earleen Newport, MD 01/24/17 [phone]

## 2017-01-23 NOTE — Discharge Instructions (Signed)
He'll need to follow up with your primary care provider, urgent care, or return to the emergency Department in 2 days for removal of packing. Begin taking antibiotics both Cipro and Flagyl for this abscess. Oxycodone is one every 6 hours as needed for pain. Do not drive or operate machinery while taking pain medication. Also the surgeons listed on call is added to your discharge instructions should you  want to discuss surgical removal of the cyst.

## 2017-01-23 NOTE — ED Notes (Signed)
Pt c/o abscess on tail bone that she noticed a few days ago. Pt states she has had an abscess in the same spot over the summer. Pt currently rates her pain at a 7 on a 1-10.

## 2017-01-23 NOTE — ED Triage Notes (Signed)
Abscess to coccyx x 5-6 days.  Has had similar abscess in the past, and was treated here.

## 2017-02-27 ENCOUNTER — Ambulatory Visit: Payer: Self-pay | Admitting: Surgery

## 2017-02-27 ENCOUNTER — Telehealth: Payer: Self-pay | Admitting: General Practice

## 2017-02-27 NOTE — Telephone Encounter (Signed)
Left a message for the patient to call the office patient no showed appointment 02/27/17 for a new patient--ED follow up--Pilonidal abscess. Please schedule if patient calls back.

## 2017-03-05 NOTE — Telephone Encounter (Signed)
Left another message for the patient to call the office. °

## 2017-03-12 ENCOUNTER — Encounter: Payer: Self-pay | Admitting: Surgery

## 2017-03-12 NOTE — Telephone Encounter (Signed)
Left another message for the patient to call the office, also I have mailed a letter for the patient to call.

## 2017-04-02 ENCOUNTER — Encounter: Payer: Self-pay | Admitting: Surgery

## 2017-04-02 ENCOUNTER — Ambulatory Visit: Payer: Self-pay | Admitting: Surgery

## 2017-04-02 VITALS — BP 125/83 | HR 102 | Temp 98.2°F | Ht 64.0 in | Wt 219.0 lb

## 2017-04-02 DIAGNOSIS — L0501 Pilonidal cyst with abscess: Secondary | ICD-10-CM

## 2017-04-02 NOTE — Patient Instructions (Signed)
You have been seen today for a Pilonidal Cyst.  To keep this cyst as minimal as possible, you will want to shave or use Veet (Hair Remover) on this area to keep as much hair out of the tracts as you can, have a family member pull hair from the tracts if possible, keep the area clean and dry as possible. Wash with soap and water once daily and keep a guaze on this area at all times while draining.  If we excise this area (surgery) in the future, you will need to arrange to be out of work for approximately 1-2 weeks and then have a family member change the dressing 1-2 times daily until this heals from the inside out.   Please call our office with any questions or concerns.    Pilonidal Cyst A pilonidal cyst is a fluid-filled sac. It forms beneath the skin near your tailbone, at the top of the crease of your buttocks. A pilonidal cyst that is not large or infected may not cause symptoms or problems. If the cyst becomes irritated or infected, it may fill with pus. This causes pain and swelling (pilonidal abscess). An infected cyst may need to be treated with medicine, drained, or removed. CAUSES The cause of a pilonidal cyst is not known. One cause may be a hair that grows into your skin (ingrown hair). RISK FACTORS Pilonidal cysts are more common in boys and men. Risk factors include:  Having lots of hair near the crease of the buttocks.  Being overweight.  Having a pilonidal dimple.  Wearing tight clothing.  Not bathing or showering frequently.  Sitting for long periods of time. SIGNS AND SYMPTOMS Signs and symptoms of a pilonidal cyst may include:  Redness.  Pain and tenderness.  Warmth.  Swelling.  Pus.  Fever. DIAGNOSIS Your health care provider may diagnose a pilonidal cyst based on your symptoms and a physical exam. The health care provider may do a blood test to check for infection. If your cyst is draining pus, your health care provider may take a sample of the  drainage to be tested at a laboratory. TREATMENT Surgery is the usual treatment for an infected pilonidal cyst. You may also have to take medicines before surgery. The type of surgery you have depends on the size and severity of the infected cyst. The different kinds of surgery include:  Incision and drainage. This is a procedure to open and drain the cyst.  Marsupialization. In this procedure, a large cyst or abscess may be opened and kept open by stitching the edges of the skin to the cyst walls.  Cyst removal. This procedure involves opening the skin and removing all or part of the cyst. HOME CARE INSTRUCTIONS  Follow all of your surgeon's instructions carefully if you had surgery.  Take medicines only as directed by your health care provider.  If you were prescribed an antibiotic medicine, finish it all even if you start to feel better.  Keep the area around your pilonidal cyst clean and dry.  Clean the area as directed by your health care provider. Pat the area dry with a clean towel. Do not rub it as this may cause bleeding.  Remove hair from the area around the cyst as directed by your health care provider.  Do not wear tight clothing or sit in one place for long periods of time.  There are many different ways to close and cover an incision, including stitches, skin glue, and adhesive strips. Follow  your health care provider's instructions on:  Incision care.  Bandage (dressing) changes and removal.  Incision closure removal. SEEK MEDICAL CARE IF:   You have drainage, redness, swelling, or pain at the site of the cyst.  You have a fever.   This information is not intended to replace advice given to you by your health care provider. Make sure you discuss any questions you have with your health care provider.   Document Released: 02/15/2000 Document Revised: 03/10/2014 Document Reviewed: 07/07/2013 Elsevier Interactive Patient Education [employer].

## 2017-04-02 NOTE — Progress Notes (Signed)
04/02/2017  Reason for Visit:  Pilonidal cyst with abscess  History of Present Illness: Angie Weber is a 37 y.o. female who presented to the ED on 01/23/17 with a pilonidal cyst with abscess.  This required I&D at bedside by the ED physician.  She was given antibiotics to take at home and discharged from the ED.  She had an appointment on 12/28 with [place] but did not show up.  She presents today for follow up.  She reports that she has been doing very well without any pain, discomfort, drainage, or other issues from the pilonidal area.  The first few days after the I&D were the worst, and then things started getting better.  She has not had a flare up since then.  Denies any fevers or chills.  Past Medical History: Past Medical History:  Diagnosis Date  . Anemia    H/O AS A CHILD  . BRCA negative 08-17-15   MyRisk  . GERD (gastroesophageal reflux disease)    OCC-NO MEDS  . Headache    MIGRAINES  . History of kidney stones      Past Surgical History: Past Surgical History:  Procedure Laterality Date  . BREAST BIOPSY Right 07-23-15   BENIGN BREAST TISSUE WITH INTRADUCTAL PAPILLOMA AND COLUMNAR CELL   . BREAST BIOPSY Left 2017   NEG  . BREAST EXCISIONAL BIOPSY Right 2017   NEG  . BREAST LUMPECTOMY Right 01/11/2016   Procedure: BREAST LUMPECTOMY;  Surgeon: Christene Lye, MD;  Location: [facility] ORS;  Service: General;  Laterality: Right;  . TUBAL LIGATION    . WISDOM TOOTH EXTRACTION      Home Medications: Prior to Admission medications   Medication Sig Start Date End Date Taking? Authorizing Provider  aspirin-acetaminophen-caffeine (EXCEDRIN MIGRAINE) [id number] MG tablet Take 2 tablets by mouth 2 (two) times daily as needed for headache.   Yes [provider]  norethindrone (AYGESTIN) 5 MG tablet Take 1 tablet (5 mg total) by mouth daily. 12/22/16  Yes Malachy Mood, MD    Allergies: Allergies  Allergen Reactions  . Penicillins Anaphylaxis  . Sulfa  Antibiotics Other (See Comments)    SORES IN MOUTH AND THROAT   Social History: Patient smokes 1/2 pack per day, does not drink alcohol or illegal drug use.  Family History: Breast cancer is aunt and grandmother  Review of Systems: Review of Systems  Constitutional: Negative for chills and fever.  Eyes: Negative for blurred vision.  Respiratory: Negative for shortness of breath.   Cardiovascular: Negative for chest pain.  Gastrointestinal: Negative for abdominal pain, nausea and vomiting.  Genitourinary: Negative for dysuria.  Musculoskeletal: Negative for myalgias.  Skin: Negative for rash.       No tenderness or drainage from pilonidal area  Neurological: Negative for dizziness.  Psychiatric/Behavioral: Negative for depression.  All other systems reviewed and are negative.   Physical Exam BP 125/83   Pulse (!) 102   Temp 98.2 F (36.8 C) (Oral)   Ht '5\' 4"'$  (1.626 m)   Wt 99.3 kg (219 lb)   BMI 37.59 kg/m  CONSTITUTIONAL: No acute distress RESPIRATORY:  Lungs are clear, and breath sounds are equal bilaterally. Normal respiratory effort without pathologic use of accessory muscles. CARDIOVASCULAR: Heart is regular without murmurs, gallops, or rubs. GI: The abdomen is soft, nondistended, nontender.  SKIN:  Area of prior I&D for pilonidal cyst is clean, dry, intact without any further evidence of infection.  The I&D site itself is well healed.  There  is one pit at the bottom of the cleft but no hairs or drainage from it.  No tenderness to palpation.  NEUROLOGIC:  Motor and sensation is grossly normal.  Cranial nerves are grossly intact. PSYCH:  Alert and oriented to person, place and time. Affect is normal.  Laboratory Analysis: No results found for this or any previous visit (from the past 24 hour(s)).  Imaging: No results found.  Assessment and Plan: This is a 37 y.o. female who presents as a follow up from a prior I&D of pilonidal cyst with abscess.  Currently the  patient is asymptomatic and is not interested in any surgical procedures unless really needed.  Discussed with patient that having had this pilonidal cyst with abscess puts her at risk for repeated episodes of abscess requiring I&D procedure, but it is not possible to say when and if these would happen.  The risk would be there until the cyst is completely excised.  At this point she is not interested in surgery and would rather approach this conservatively.  This is overall reasonable given that she's gone two months without any issues.  Did tell the patient that she's at risk and if there are any issues, to feel free to contact us so we can evaluate further.  If she has another flare up and is caught early, may be ok for just abx alone, but she may need repeat I&D.  Main goal for now is to keep the area clean and dry.  Patient understands this plan and all of her questions have been answered.  Face-to-face time spent with the patient and care providers was 45 minutes, with more than 50% of the time spent counseling, educating, and coordinating care of the patient.     Melvyn Neth, [place]

## 2017-08-05 ENCOUNTER — Ambulatory Visit: Payer: Self-pay

## 2017-09-07 ENCOUNTER — Other Ambulatory Visit: Payer: Self-pay

## 2017-09-07 ENCOUNTER — Encounter (INDEPENDENT_AMBULATORY_CARE_PROVIDER_SITE_OTHER): Payer: Self-pay

## 2017-09-07 ENCOUNTER — Ambulatory Visit: Payer: Self-pay | Attending: Oncology

## 2017-09-07 VITALS — Ht 65.0 in | Wt 224.0 lb

## 2017-09-07 DIAGNOSIS — R87810 Cervical high risk human papillomavirus (HPV) DNA test positive: Secondary | ICD-10-CM

## 2017-09-07 NOTE — Progress Notes (Signed)
  Subjective:     Patient ID: Angie Weber, female   DOB: 10-03-1980, 37 y.o.   MRN: [id number]  HPI   Review of Systems     Objective:   Physical Exam  Genitourinary: No labial fusion. There is no rash, tenderness, lesion or injury on the right labia. There is no rash, tenderness, lesion or injury on the left labia. Uterus is not deviated, not enlarged, not fixed and not tender. Cervix exhibits no motion tenderness, no discharge and no friability. Right adnexum displays no mass, no tenderness and no fullness. Left adnexum displays no mass, no tenderness and no fullness. No erythema, tenderness or bleeding in the vagina. No foreign body in the vagina. No signs of injury around the vagina. No vaginal discharge found.       Assessment:    37 year old patient presents for [place] clinic visit.  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  Dr. Fransisca Connors recommended one year follow-up pap.  Pelvic exam normal. Patient reports 40 pound weight gain since starting on birth control pill.     Plan:     Specimen collected for pap.  Will follow per BCCCP guidelines.

## 2017-09-08 ENCOUNTER — Encounter: Payer: Self-pay | Admitting: Surgery

## 2017-09-08 ENCOUNTER — Telehealth: Payer: Self-pay | Admitting: Surgery

## 2017-09-08 ENCOUNTER — Ambulatory Visit: Payer: Self-pay | Admitting: Surgery

## 2017-09-08 DIAGNOSIS — L0591 Pilonidal cyst without abscess: Secondary | ICD-10-CM | POA: Insufficient documentation

## 2017-09-08 MED ORDER — CLINDAMYCIN HCL 300 MG PO CAPS: 300.0000 mg | ORAL_CAPSULE | Freq: Three times a day (TID) | ORAL | 0 refills | Status: DC

## 2017-09-08 NOTE — Progress Notes (Signed)
09/08/2017  History of Present Illness: Angie Weber is a 37 y.o. female with a history of pilonidal cyst, last seen in 03/2017 after a flare up requiring I&D in the ED in 01/2017.  She reports that three days ago she started having a bit of discomfort in the gluteal cleft and this worsened yesterday.  She feels the area of discomfort is on the left side of the cleft, whereas the I&D was done on the right side.  Denies any fevers, chills, drainage, induration or swelling.  She notices some slight erythema in the area.  Has not tried any medications at home.  Past Medical History: Past Medical History:  Diagnosis Date  . Anemia    H/O AS A CHILD  . BRCA negative 08-17-15   MyRisk  . GERD (gastroesophageal reflux disease)    OCC-NO MEDS  . Headache    MIGRAINES  . History of kidney stones      Past Surgical History: Past Surgical History:  Procedure Laterality Date  . BREAST BIOPSY Right 07-23-15   BENIGN BREAST TISSUE WITH INTRADUCTAL PAPILLOMA AND COLUMNAR CELL   . BREAST BIOPSY Left 2017   NEG  . BREAST EXCISIONAL BIOPSY Right 2017   NEG  . BREAST LUMPECTOMY Right 01/11/2016   Procedure: BREAST LUMPECTOMY;  Surgeon: Christene Lye, MD;  Location: [facility] ORS;  Service: General;  Laterality: Right;  . TUBAL LIGATION    . WISDOM TOOTH EXTRACTION      Home Medications: Prior to Admission medications   Medication Sig Start Date End Date Taking? Authorizing Provider  aspirin-acetaminophen-caffeine (EXCEDRIN MIGRAINE) [phone] MG tablet Take 2 tablets by mouth 2 (two) times daily as needed for headache.   Yes [provider]  norethindrone (AYGESTIN) 5 MG tablet Take 1 tablet (5 mg total) by mouth daily. 12/22/16  Yes Malachy Mood, MD  clindamycin (CLEOCIN) 300 MG capsule Take 1 capsule (300 mg total) by mouth 3 (three) times daily. 09/08/17   Olean Ree, MD    Allergies: Allergies  Allergen Reactions  . Penicillins Anaphylaxis  . Sulfa Antibiotics Other  (See Comments)    SORES IN MOUTH AND THROAT    Review of Systems: Review of Systems  Constitutional: Negative for chills and fever.  Respiratory: Negative for shortness of breath.   Cardiovascular: Negative for chest pain.  Skin:       Mild erythema at gluteal cleft site, with discomfort in the area.    Physical Exam BP 130/87   Pulse 96   Temp 97.9 F (36.6 C) (Oral)   Ht 5' 5" (1.651 m)   Wt 225 lb 9.6 oz (102.3 kg)   BMI 37.54 kg/m  CONSTITUTIONAL: No acute distress HEENT:  Normocephalic, atraumatic, extraocular motion intact. RESPIRATORY:  Lungs are clear, and breath sounds are equal bilaterally. Normal respiratory effort without pathologic use of accessory muscles. CARDIOVASCULAR: Heart is regular without murmurs, gallops, or rubs. SKIN: There is minimal erythema in the middle of the gluteal cleft.  There is no fluctuance and there is no induration.  There is some discomfort to palpation in the upper portion of the cleft and to the left side of the upper cleft.  Prior I&D scar on right and inferior cleft are well healed.  Two pits in the cleft area without drainage. NEUROLOGIC:  Motor and sensation is grossly normal.  Cranial nerves are grossly intact. PSYCH:  Alert and oriented to person, place and time. Affect is normal.  Labs/Imaging: None  Assessment and Plan:  This is a 37 y.o. female who presents with an early flare-up of her pilonidal cyst.  Discussed with the patient like last time that she has a new flare up and unfortunately is at risk of having further flare-ups in the future.  In this case, there is no fluctuance or significant induration, and an I&D is not required. Would proceed with antibiotic management at this point, with clindamycin course.  Discussed with the patient that we should plan for surgery in the future as this flare up will only keep recurring.  However, the patient does not have insurance and she would want to save money first before proceeding.   She also understands that there is recovery time with this surgery and she wants to make sure she has the time as well.  For now, will proceed with antibiotic management and have her follow up in two weeks to make sure she's getting better.  Then we can plan with time when to do her surgery in the future.  We will get her in touch with our billing coordinator to discuss payments and costs.  Patient understands this plan and all of her questions have been answered.  Face-to-face time spent with the patient and care providers was 25 minutes, with more than 50% of the time spent counseling, educating, and coordinating care of the patient.     Melvyn Neth, [facility]

## 2017-09-08 NOTE — Patient Instructions (Signed)
You may call Angie to get a Quote on the procedure cost @ [phone].  You may want to complete Financial paperwork at [facility] to see if you are eligible.   Please pick up your medication at the pharmacy.    Pilonidal Cyst A pilonidal cyst is a fluid-filled sac. It forms beneath the skin near your tailbone, at the top of the crease of your buttocks. A pilonidal cyst that is not large or infected may not cause symptoms or problems. If the cyst becomes irritated or infected, it may fill with pus. This causes pain and swelling (pilonidal abscess). An infected cyst may need to be treated with medicine, drained, or removed. What are the causes? The cause of a pilonidal cyst is not known. One cause may be a hair that grows into your skin (ingrown hair). What increases the risk? Pilonidal cysts are more common in boys and men. Risk factors include:  Having lots of hair near the crease of the buttocks.  Being overweight.  Having a pilonidal dimple.  Wearing tight clothing.  Not bathing or showering frequently.  Sitting for long periods of time.  What are the signs or symptoms? Signs and symptoms of a pilonidal cyst may include:  Redness.  Pain and tenderness.  Warmth.  Swelling.  Pus.  Fever.  How is this diagnosed? Your health care provider may diagnose a pilonidal cyst based on your symptoms and a physical exam. The health care provider may do a blood test to check for infection. If your cyst is draining pus, your health care provider may take a sample of the drainage to be tested at a laboratory. How is this treated? Surgery is the usual treatment for an infected pilonidal cyst. You may also have to take medicines before surgery. The type of surgery you have depends on the size and severity of the infected cyst. The different kinds of surgery include:  Incision and drainage. This is a procedure to open and drain the cyst.  Marsupialization. In this procedure, a large cyst  or abscess may be opened and kept open by stitching the edges of the skin to the cyst walls.  Cyst removal. This procedure involves opening the skin and removing all or part of the cyst.  Follow these instructions at home:  Follow all of your surgeon's instructions carefully if you had surgery.  Take medicines only as directed by your health care provider.  If you were prescribed an antibiotic medicine, finish it all even if you start to feel better.  Keep the area around your pilonidal cyst clean and dry.  Clean the area as directed by your health care provider. Pat the area dry with a clean towel. Do not rub it as this may cause bleeding.  Remove hair from the area around the cyst as directed by your health care provider.  Do not wear tight clothing or sit in one place for long periods of time.  There are many different ways to close and cover an incision, including stitches, skin glue, and adhesive strips. Follow your health care provider's instructions on: ? Incision care. ? Bandage (dressing) changes and removal. ? Incision closure removal. Contact a health care provider if:  You have drainage, redness, swelling, or pain at the site of the cyst.  You have a fever. This information is not intended to replace advice given to you by your health care provider. Make sure you discuss any questions you have with your health care provider. Document Released: 02/15/2000  Document Revised: 07/26/2015 Document Reviewed: 07/07/2013 Elsevier Interactive Patient Education  [employer].

## 2017-09-08 NOTE — Telephone Encounter (Signed)
Called patient to let her know that if she was in pain and had redness on the area, then I would prefer for her to be seen by Dr. Hampton Abbot so he could start her on antibiotics and possibly discuss surgery options. Patient agreed, therefore, I scheduled her to be seen today at 11:00 AM. Patient had no further questions.

## 2017-09-08 NOTE — Telephone Encounter (Signed)
Patients calling Angie Weber saw Dr. Hampton Abbot awhile back when Angie Weber had a cyst and was told to call our office if the cyst came back that Dr. Hampton Abbot would call something in for her or if it was bad Angie Weber may need to be seen. Patient denies drainage, said he has some pain, but not like it has been in the past. Please call patient and advise.

## 2017-09-09 ENCOUNTER — Telehealth: Payer: Self-pay | Admitting: Surgery

## 2017-09-09 LAB — PAP LB AND HPV HIGH-RISK
HPV, HIGH-RISK: POSITIVE — AB
PAP SMEAR COMMENT: 0

## 2017-09-09 NOTE — Telephone Encounter (Signed)
Patient is calling said she was seen yesterday for an abcess and is complaining of having some pain, pain level being about a five or six today and said it was getting bigger. patient is at work until 4:30 and can be reached at [phone]. Or [phone] after 4:30. Please call patient and advise.

## 2017-09-10 ENCOUNTER — Ambulatory Visit (INDEPENDENT_AMBULATORY_CARE_PROVIDER_SITE_OTHER): Payer: Self-pay | Admitting: Surgery

## 2017-09-10 ENCOUNTER — Encounter: Payer: Self-pay | Admitting: Surgery

## 2017-09-10 ENCOUNTER — Other Ambulatory Visit: Payer: Self-pay | Admitting: Surgery

## 2017-09-10 VITALS — BP 147/104 | HR 114 | Temp 97.5°F | Ht 65.0 in | Wt 222.0 lb

## 2017-09-10 DIAGNOSIS — L0591 Pilonidal cyst without abscess: Secondary | ICD-10-CM

## 2017-09-10 MED ORDER — OXYCODONE HCL 5 MG PO TABS: 5.0000 mg | ORAL_TABLET | ORAL | 0 refills | Status: DC | PRN

## 2017-09-10 NOTE — Telephone Encounter (Signed)
Patient has called back again this morning, said she never received a phone call yesterday. She can be reached at [phone]. Please call patient and advise.

## 2017-09-10 NOTE — Progress Notes (Signed)
09/10/2017  History of Present Illness: Angie Weber is a 37 y.o. female seen two days ago with what appeared to be a flare-up of a pilonidal cyst.  Her last episode was 01/23/17 which required I&D in the ED.  She was started on Clindamycin and has been taking it as indicated.  However, she reports that her pain is worsening and there is more swelling.  Denies any fevers or chills.  Does report that the clindamycin had caused an upset stomach but she was doing well otherwise with it.  Past Medical History: Past Medical History:  Diagnosis Date  . Anemia    H/O AS A CHILD  . BRCA negative 08-17-15   MyRisk  . GERD (gastroesophageal reflux disease)    OCC-NO MEDS  . Headache    MIGRAINES  . History of kidney stones      Past Surgical History: Past Surgical History:  Procedure Laterality Date  . BREAST BIOPSY Right 07-23-15   BENIGN BREAST TISSUE WITH INTRADUCTAL PAPILLOMA AND COLUMNAR CELL   . BREAST BIOPSY Left 2017   NEG  . BREAST EXCISIONAL BIOPSY Right 2017   NEG  . BREAST LUMPECTOMY Right 01/11/2016   Procedure: BREAST LUMPECTOMY;  Surgeon: Christene Lye, MD;  Location: [facility] ORS;  Service: General;  Laterality: Right;  . TUBAL LIGATION    . WISDOM TOOTH EXTRACTION      Home Medications: Prior to Admission medications   Medication Sig Start Date End Date Taking? Authorizing Provider  aspirin-acetaminophen-caffeine (EXCEDRIN MIGRAINE) [phone] MG tablet Take 2 tablets by mouth 2 (two) times daily as needed for headache.   Yes [provider]  clindamycin (CLEOCIN) 300 MG capsule Take 1 capsule (300 mg total) by mouth 3 (three) times daily. 09/08/17  Yes Delesha Pohlman, Jacqulyn Bath, MD  norethindrone (AYGESTIN) 5 MG tablet Take 1 tablet (5 mg total) by mouth daily. 12/22/16  Yes Malachy Mood, MD    Allergies: Allergies  Allergen Reactions  . Penicillins Anaphylaxis  . Sulfa Antibiotics Other (See Comments)    SORES IN MOUTH AND THROAT    Review of  Systems: Review of Systems  Constitutional: Negative for chills and fever.  Respiratory: Negative for shortness of breath.   Cardiovascular: Negative for chest pain.  Skin:       Worsening pain at the pilonidal cyst area    Physical Exam BP (!) 147/104   Pulse (!) 114   Temp (!) 97.5 F (36.4 C) (Oral)   Ht '5\' 5"'$  (1.651 m)   Wt 222 lb (100.7 kg)   BMI 36.94 kg/m  Constitutional:  No acute distress, well nourished Skin:  Increased erythema surrounding the upper gluteal cleft at the site of the pilonidal cyst.  There is an area of fluctuance at the inferior portion, with increased tenderness compared to her prior exam.  This is consistent with an abscess.  Labs/Imaging: None  Assessment and Plan: This is a 37 y.o. female who presents with a pilonidal cyst with now abscess formation.    Discussed with the patient that she would require an I&D procedure again given the abscess formation.  Discussed risks of bleeding, infection, injury to surrounding structures and she was willing to proceed.  Procedure:  I&D of pilonidal cyst abscess.  The upper gluteal cleft was prepped and draped in sterile fashion.  1% lidocaine with epi and 1:10 bicarb was infiltrated at the abscess.  A 1.5 cm elliptical incision was made at the area of fluctuance, expressing immediately copious amount  of purulent fluid.  This was swabbed for culture which will be sent to micro.  Manual pressure was used to express all the pus out of the cavity.  The cavity was measured using the tip of a forcep clamp, to be about 4 cm wide and 3 cm long, and 2 cm deep.  The cavity was irrigated and dried.  The cavity was then gently packed with gauze and dressed with dry gauze and tape.  The patient tolerated the procedure well and all sharps were appropriately disposed of.  Patient instructions given:  Will do twice a day packing dressing change through the weekend (3 days), and then change to once daily.  She will continue her  clindamycin and we will contact her if the micro cultures show resistance so that we can change her prescription.  We will give her a prescription for Oxycodone 5 mg #20 tablets for pain control. Otherwise we will see her next week to check on her wound and progress.  Patient understands that she should highly consider having pilonidal cyst excision in the future when this infection heals.  Her husband has picked up the financial information from here and they will start checking on costs to save money for the surgery.   Melvyn Neth, [facility]

## 2017-09-10 NOTE — Addendum Note (Signed)
Addended by: Wayna Chalet on: 09/10/2017 04:40 PM   Modules accepted: Orders

## 2017-09-10 NOTE — Telephone Encounter (Signed)
Called patient back and recommended for her to take Ibuprofen for her pain. However, she stated that she had done that and that her pain is not under control. Therefore, since I had spoken with Dr. Hampton Abbot, he recommended to see her today or tomorrow. This was offered to the patient and she stated that she could be here at 2:30 PM today. I will put her on the schedule.

## 2017-09-10 NOTE — Telephone Encounter (Signed)
Patient called and left a message on lunch said she still hasn't heard anything from a nurse and is complaining of her cyst getting bigger. Please call patient and advise.

## 2017-09-10 NOTE — Patient Instructions (Signed)
Please continue taking the antibiotic that he had given you until we give you a call with your culture results in case you need to change the antibiotic.  Please change your dressing twice a day and try to keep it clean.  Please give [place] a call in case you have any questions or concerns.

## 2017-09-13 LAB — WOUND CULTURE

## 2017-09-14 ENCOUNTER — Telehealth: Payer: Self-pay

## 2017-09-14 NOTE — Telephone Encounter (Signed)
Called Angie Weber to let her know that her culture came back and that she should continue taking her antibiotics. Patient understood and had no further questions. I also asked how she was doing/feeling and she stated that she was feeling better.

## 2017-09-15 ENCOUNTER — Encounter: Payer: Self-pay | Admitting: Surgery

## 2017-09-18 ENCOUNTER — Encounter: Payer: Self-pay | Admitting: Surgery

## 2017-09-18 ENCOUNTER — Ambulatory Visit (INDEPENDENT_AMBULATORY_CARE_PROVIDER_SITE_OTHER): Payer: Self-pay | Admitting: Surgery

## 2017-09-18 VITALS — BP 126/84 | HR 93 | Ht 65.0 in | Wt 223.0 lb

## 2017-09-18 DIAGNOSIS — Z09 Encounter for follow-up examination after completed treatment for conditions other than malignant neoplasm: Secondary | ICD-10-CM

## 2017-09-18 DIAGNOSIS — L0591 Pilonidal cyst without abscess: Secondary | ICD-10-CM

## 2017-09-18 NOTE — Patient Instructions (Addendum)
Return in two weeks. Keep area clean and dry.

## 2017-09-18 NOTE — Progress Notes (Signed)
09/18/2017  HPI: Patient is s/p I&D of pilonidal cyst abscess on 7/11.  She presents for follow up.  She reports that her daughter has been helping her with the dressing changes twice daily and her pain has resolved.  She finished the antibiotic course as well.  No fevers, chills, worsening pain.  Vital signs: BP 126/84   Pulse 93   Ht 5\' 5"  (1.651 m)   Wt 223 lb (101.2 kg)   BMI 37.11 kg/m    Physical Exam: Constitutional: No acute distress Skin:  I&D of pilonidal abscess.  Area is clean without further purulence and without any erythema.  The wound now measures about 1 cm size, with about 0.5 cm depth.  Wound was dressed with dry gauze and tape.  Assessment/Plan: 37 yo female s/p I&D of pilonidal cyst abscess  --Instructed the patient that she can do once daily dressing changes now, and to continue with packing of the wound.  Most likely by early next week she wont be able to pack anymore and instructed her at that point to change to just superficial dry gauze dressing. --Patient is working on her financial and insurance issues to be able to afford surgery for pilonidal cyst excision in the future.  Also gave the patient information on [place] care. --She will follow up in two weeks for wound check.   Melvyn Neth, [facility]

## 2017-10-01 ENCOUNTER — Ambulatory Visit (INDEPENDENT_AMBULATORY_CARE_PROVIDER_SITE_OTHER): Payer: Self-pay | Admitting: Surgery

## 2017-10-01 ENCOUNTER — Encounter: Payer: Self-pay | Admitting: Surgery

## 2017-10-01 VITALS — BP 140/104 | HR 98 | Temp 98.1°F | Wt 222.0 lb

## 2017-10-01 DIAGNOSIS — L0591 Pilonidal cyst without abscess: Secondary | ICD-10-CM

## 2017-10-01 DIAGNOSIS — Z4889 Encounter for other specified surgical aftercare: Secondary | ICD-10-CM

## 2017-10-01 NOTE — Progress Notes (Signed)
Surgical Clinic Progress/Follow-up Note   HPI:  37 y.o. Female presents to clinic for follow-up 3 weeks s/p incision and drainage of recurrent pilonidal abscess ([place], 09/10/2017). Patient reports her daughter has been helping to change her packing once daily as advised last week until there's been very little left to pack over the past 2 days with minimal peri-incisional pain or drainage. Patient otherwise denies N/V, fever/chills, CP, or SOB.  Review of Systems:  Constitutional: denies fever/chills  Respiratory: denies shortness of breath, wheezing  Cardiovascular: denies chest pain, palpitations  Gastrointestinal: denies abdominal pain, N/V, or diarrhea Skin: Denies any other rashes or skin discolorations except post-surgical wound as per interval history  Vital Signs:  BP (!) 140/104   Pulse 98   Temp 98.1 F (36.7 C) (Oral)   Wt 222 lb (100.7 kg)   BMI 36.94 kg/m    Physical Exam:  Constitutional:  -- Overweight body habitus  -- Awake, alert, and oriented x3  Pulmonary:  -- No crackles -- Equal breath sounds bilaterally -- Breathing non-labored at rest Cardiovascular:  -- S1, S2 present  -- No pericardial rubs  Gastrointestinal:  -- Soft and non-distended, non-tender to palpation, no guarding/rebound tenderness -- No abdominal masses appreciated, pulsatile or otherwise  Musculoskeletal / Integumentary:  -- Wounds or skin discoloration: Shallow 1 cm diameter 2 - 3 mm depth sacro-coccygeal post-drainage wound with pink healthy granulation tissue and no surrounding erythema or purulent drainage, minimal tenderness to palpation -- Extremities: B/L UE and LE FROM, hands and feet warm, no edema   Assessment:  37 y.o. yo Female with a problem list including...  Patient Active Problem List   Diagnosis Date Noted  . Pilonidal cyst 09/08/2017    presents to clinic for follow-up evaluation, doing well 3 weeks s/p incision and drainage of recurrent pilonidal abscess  ([place], 09/10/2017).  Plan:              - continue current wound management with dry gauze dressing and packing as able             - return to clinic for follow-up with Dr. Hampton Abbot upon completion of charity care application  - instructed to call office if any questions or concerns  All of the above recommendations were discussed with the patient, and all of patient's questions were answered to her expressed satisfaction.  -- Marilynne Drivers Rosana Hoes, MD, [place]: [facility] - Partnering for exceptional care. Office: [phone]

## 2017-10-01 NOTE — Patient Instructions (Signed)
Please continue dressing the wound. Remember to pack the wound daily at least twice daily.  Please give Angie Weber a call once you know about charity care acceptance so we could schedule you a follow up appointment with Dr. Hampton Abbot.  Please give Angie Weber a call in case you have any questions or concerns.

## 2017-10-07 ENCOUNTER — Encounter: Payer: Self-pay | Admitting: Surgery

## 2017-11-26 ENCOUNTER — Encounter: Payer: Self-pay | Admitting: Surgery

## 2017-11-26 ENCOUNTER — Ambulatory Visit (INDEPENDENT_AMBULATORY_CARE_PROVIDER_SITE_OTHER): Payer: Self-pay | Admitting: Surgery

## 2017-11-26 VITALS — BP 163/114 | HR 112 | Temp 96.6°F | Wt 224.0 lb

## 2017-11-26 DIAGNOSIS — L0591 Pilonidal cyst without abscess: Secondary | ICD-10-CM

## 2017-11-26 NOTE — Progress Notes (Signed)
Surgical Clinic Progress/Follow-up Note   HPI:  37 y.o. Female presents to clinic for follow-up evaluation she requested after her pilonidal cyst began draining clear fluid this morning 2 months s/p incision and drainage of her previously infected pilonidal cyst with abscess ([place], 09/10/2017). Patient says her pilonidal cyst has continuously alternated over the past couple of month between increased size with more pain or decreased size and less pain. Since her prior visit, patient says she completed and submitted charity care application, but has yet to hear back.  Patient otherwise denies any fever/chills or N/V and is unsure whether her sacro-coccygeal drainage has been purulent, but the toilet paper she's used to cover her draining cyst has had only clear-yellow non-purulent staining. On it. Patient also denies any CP or SOB.  Review of Systems:  Constitutional: denies any other weight loss, fever, chills, or sweats  Eyes: denies any other vision changes, history of eye injury  ENT: denies sore throat, hearing problems  Respiratory: denies shortness of breath, wheezing  Cardiovascular: denies chest pain, palpitations  Gastrointestinal: abdominal pain, N/V, and bowel function as per HPI Musculoskeletal: denies any other joint pains or cramps  Skin: Denies any other rashes or skin discolorations  Neurological: denies any other headache, dizziness, weakness  Psychiatric: denies any other depression, anxiety  All other review of systems: otherwise negative   Vital Signs:  BP (!) 163/114   Pulse (!) 112   Temp (!) 96.6 F (35.9 C) (Skin)   Wt 224 lb (101.6 kg)   BMI 37.28 kg/m    Physical Exam:  Constitutional:  -- Obese body habitus  -- Awake, alert, and oriented x3  Eyes:  -- Pupils equally round and reactive to light  -- No scleral icterus  Ear, nose, throat:  -- No jugular venous distension  -- No nasal drainage, bleeding Pulmonary:  -- No crackles -- Equal breath  sounds bilaterally -- Breathing non-labored at rest Cardiovascular:  -- S1, S2 present  -- No pericardial rubs  Gastrointestinal:  -- Soft, nontender, non-distended, no guarding/rebound  -- No abdominal masses appreciated, pulsatile or otherwise  Musculoskeletal / Integumentary:  -- Wounds or skin discoloration: Moderately tender to palpation non-erythematous sacrococcygeal pilonidal cyst and sinus with thin clear non-purulent drainage  -- Extremities: B/L UE and LE FROM, hands and feet warm, no edema  Neurologic:  -- Motor function: intact and symmetric  -- Sensation: intact and symmetric   Laboratory studies: CBC: No results found for: WBC, RBC BMP: No results found for: GLUCOSE, POTASSIUM, CHLORIDE, CO2, BUN, CREATININE, CALCIUM   Imaging: No new pertinent imaging studies available for review   Assessment:  37 y.o. yo Female with a problem list including...  Patient Active Problem List   Diagnosis Date Noted  . Pilonidal cyst 09/08/2017    presents to clinic for increasingly symptomatic pilonidal cyst 2 months s/p incision and drainage for infected pilonidal cyst and sinus tract with pilonidal abscess from which she has since recovered and is awaiting response to completed charity care application.  Plan:  - continue current wound management with dry gauze dressing and packing as able - return to clinic for follow-up with Dr. Hampton Abbot upon completion of charity care application             - instructed to call office if any questions or concerns  All of the above recommendations were discussed with the patient, and all of patient's questions were answered to her expressed satisfaction.  -- Marilynne Drivers Rosana Hoes, MD,  [facility]: [facility] - Partnering for exceptional care. Office: [phone] 

## 2017-11-26 NOTE — Patient Instructions (Signed)
Please give us a call in case you have any questions or concerns.  

## 2017-12-10 ENCOUNTER — Telehealth: Payer: Self-pay | Admitting: Obstetrics and Gynecology

## 2017-12-10 NOTE — Telephone Encounter (Signed)
Patient is calling to schedule follow up per [facility] for abnormal pap. Please advise what we need to schedule for patient?

## 2017-12-10 NOTE — Telephone Encounter (Signed)
colposcopy

## 2017-12-10 NOTE — Telephone Encounter (Signed)
Called and left voice mail for patient to call back to be schedule °

## 2017-12-24 ENCOUNTER — Ambulatory Visit (INDEPENDENT_AMBULATORY_CARE_PROVIDER_SITE_OTHER): Payer: Self-pay | Admitting: Obstetrics and Gynecology

## 2017-12-24 ENCOUNTER — Encounter: Payer: Self-pay | Admitting: Obstetrics and Gynecology

## 2017-12-24 VITALS — BP 120/70 | HR 100 | Ht 64.5 in | Wt 228.0 lb

## 2017-12-24 DIAGNOSIS — R87810 Cervical high risk human papillomavirus (HPV) DNA test positive: Secondary | ICD-10-CM

## 2017-12-24 MED ORDER — NORETHINDRONE ACETATE 5 MG PO TABS: 2.5000 mg | ORAL_TABLET | Freq: Every day | ORAL | 3 refills | Status: DC

## 2017-12-24 NOTE — Progress Notes (Signed)
Obstetrics & Gynecology Office Visit   Chief Complaint: No chief complaint on file.   History of Present Illness:Angie Weber is a 37 y.o. woman who presents today for continued surveillance for history of dysplasia. Last pap obtained on 09/07/17 revealed NIL HPV positive.    Pap/Treatment History:  08/07/2016 Colposcopy negative ectocervical bx and endocervical bx 07/02/2016 NIL HPV positive 07/24/2016 NIL HPV positive  Review of Systems: Review of Systems  Constitutional: Negative.   Genitourinary: Negative.   Skin: Negative.    Past Medical History:  Past Medical History:  Diagnosis Date  . Anemia    H/O AS A CHILD  . BRCA negative 08-17-15   MyRisk  . GERD (gastroesophageal reflux disease)    OCC-NO MEDS  . Headache    MIGRAINES  . History of kidney stones     Past Surgical History:  Past Surgical History:  Procedure Laterality Date  . BREAST BIOPSY Right 07-23-15   BENIGN BREAST TISSUE WITH INTRADUCTAL PAPILLOMA AND COLUMNAR CELL   . BREAST BIOPSY Left 2017   NEG  . BREAST EXCISIONAL BIOPSY Right 2017   NEG  . BREAST LUMPECTOMY Right 01/11/2016   Procedure: BREAST LUMPECTOMY;  Surgeon: Christene Lye, MD;  Location: [facility] ORS;  Service: General;  Laterality: Right;  . TUBAL LIGATION    . WISDOM TOOTH EXTRACTION      Gynecologic History: No LMP recorded. (Menstrual status: Oral contraceptives).  Obstetric History: G2P2  Family History:  Family History  Problem Relation Age of Onset  . Breast cancer Maternal Aunt        4 mat. aunts 56's and 64's  . Breast cancer Maternal Grandmother   . Breast cancer Cousin        5 maternal cousins    Social History:  Social History   Socioeconomic History  . Marital status: Married    Spouse name: Not on file  . Number of children: Not on file  . Years of education: Not on file  . Highest education level: Not on file  Occupational History  . Not on file  Social Needs  . Financial resource  strain: Not on file  . Food insecurity:    Worry: Not on file    Inability: Not on file  . Transportation needs:    Medical: Not on file    Non-medical: Not on file  Tobacco Use  . Smoking status: Current Every Day Smoker    Packs/day: 0.50    Years: 17.00    Pack years: 8.50    Types: Cigarettes  . Smokeless tobacco: Never Used  Substance and Sexual Activity  . Alcohol use: No    Alcohol/week: 0.0 standard drinks  . Drug use: No  . Sexual activity: Yes    Birth control/protection: None  Lifestyle  . Physical activity:    Days per week: Not on file    Minutes per session: Not on file  . Stress: Not on file  Relationships  . Social connections:    Talks on phone: Not on file    Gets together: Not on file    Attends religious service: Not on file    Active member of club or organization: Not on file    Attends meetings of clubs or organizations: Not on file    Relationship status: Not on file  . Intimate partner violence:    Fear of current or ex partner: Not on file    Emotionally abused: Not on  file    Physically abused: Not on file    Forced sexual activity: Not on file  Other Topics Concern  . Not on file  Social History Narrative  . Not on file    Allergies:  Allergies  Allergen Reactions  . Penicillins Anaphylaxis  . Sulfa Antibiotics Other (See Comments)    SORES IN MOUTH AND THROAT    Medications: Prior to Admission medications   Medication Sig Start Date End Date Taking? Authorizing Provider  aspirin-acetaminophen-caffeine (EXCEDRIN MIGRAINE) [phone] MG tablet Take 2 tablets by mouth 2 (two) times daily as needed for headache.    [provider]  norethindrone (AYGESTIN) 5 MG tablet Take 1 tablet (5 mg total) by mouth daily. 12/22/16   Malachy Mood, MD    Physical Exam Vitals: There were no vitals filed for this visit. No LMP recorded. (Menstrual status: Oral contraceptives).  General: NAD HEENT: normocephalic,  anicteric Pulmonary: No increased work of breathing Genitourinary:  External: Normal external female genitalia.  Normal urethral meatus, normal  Bartholin's and Skene's glands.    Vagina: Normal vaginal mucosa, no evidence of prolapse.    Cervix: Grossly normal in appearance, no bleeding  Uterus: Non-enlarged, mobile, normal contour.  No CMT  Adnexa: ovaries non-enlarged, no adnexal masses  Rectal: deferred  Lymphatic: no evidence of inguinal lymphadenopathy Extremities: no edema, erythema, or tenderness Neurologic: Grossly intact Psychiatric: mood appropriate, affect full  Female chaperone present for pelvic and breast  portions of the physical exam   GYNECOLOGY CLINIC COLPOSCOPY PROCEDURE NOTE  37 y.o. G2P2 here for colposcopy for NIL and HR HPV+  pap smear. Discussed underlying role for HPV infection in the development of cervical dysplasia, its natural history and progression/regression, need for surveillance.  Is the patient  pregnant: No LMP: No LMP recorded. (Menstrual status: Oral contraceptives). Smoking status:  reports that she has been smoking cigarettes. She has a 8.50 pack-year smoking history. She has never used smokeless tobacco. Contraception: oral progesterone-only contraceptive  Patient given informed consent, signed copy in the chart, time out was performed.  The patient was position in dorsal lithotomy position. Speculum was placed the cervix was visualized.   After application of acetic acid colposcopic inspection of the cervix was undertaken.   Colposcopy adequate, full visualization of transformation zone: Yes no visible lesions; corresponding biopsies obtained.   ECC specimen obtained:  Yes  All specimens were labeled and sent to pathology.   Patient was given post procedure instructions.  Will follow up pathology and manage accordingly.  Routine preventative health maintenance measures emphasized.  OBGyn Exam     Assessment: 37 y.o. G2P2 follow up for  NIL HPV positive pap  Plan: Problem List Items Addressed This Visit    None      - Follow up pap smear from today.   - I had a lengthly discussion with MARILUZ CRESPO  regarding the cause of dysplasia of the lower genital tract (including immunosuppression in the setting of HPV exposure and tobacco exposure). I explained the potential for progression to invasive malignancy, the recurrent nature of these lesions (and the need for close continued followup). Results of today's pap will dictate need for further evaluation and follow up per ASCCP guidelines..  - She is comfortable with the plan and had her questions answered.  - No follow-ups on file.   Malachy Mood, MD, [facility], [facility] 12/24/2017, 2:28 PM

## 2017-12-30 LAB — PATHOLOGY

## 2018-01-06 ENCOUNTER — Telehealth: Payer: Self-pay | Admitting: Obstetrics and Gynecology

## 2018-01-06 NOTE — Telephone Encounter (Signed)
Patient is aware of H&P on day of surgery, Pre-admit Testing phone interview to be scheduled (patient will watch for the notification in [place]), and OR on 02/23/18. Patient is aware she may receive calls from the [facility] and [facility]. Ext given.

## 2018-01-06 NOTE — Telephone Encounter (Signed)
-----   Message from Malachy Mood, MD sent at 01/05/2018  2:06 PM EST ----- Regarding: surgery Surgery Date:   LOS: same day surgery  Surgery Booking Request Patient Full Name: Angie Weber MRN: [id number]  DOB: [birth year]  Surgeon: Malachy Mood, MD  Requested Surgery Date and Time: 1-6 weeks Primary Diagnosis and Code: Severe dysplasia cervix Secondary Diagnosis and Code:  Surgical Procedure: Cold knife conization L&D Notification:N/A Admission Status: same day surgery Length of Surgery: 1hr Special Case Needs: none H&P: week of or date of (date) Phone Interview or Office Pre-Admit: phone interview Interpreter: No Language: English Medical Clearance: No Special Scheduling Instructions: none

## 2018-01-07 ENCOUNTER — Other Ambulatory Visit: Payer: Self-pay | Admitting: Obstetrics and Gynecology

## 2018-01-10 ENCOUNTER — Other Ambulatory Visit: Payer: Self-pay | Admitting: Obstetrics and Gynecology

## 2018-02-15 ENCOUNTER — Encounter
Admission: RE | Admit: 2018-02-15 | Discharge: 2018-02-15 | Disposition: A | Discharge status: Home / Self Care | Payer: Self-pay | Source: Ambulatory Visit | Attending: Obstetrics and Gynecology | Admitting: Obstetrics and Gynecology

## 2018-02-15 ENCOUNTER — Other Ambulatory Visit: Payer: Self-pay

## 2018-02-15 NOTE — Patient Instructions (Signed)
Your procedure is scheduled on: 02/23/18 Report to [place]. To find out your arrival time please call [phone] between 1PM - 3PM on 02/22/18.  Remember: Instructions that are not followed completely may result in serious medical risk, up to and including death, or upon the discretion of your surgeon and anesthesiologist your surgery may need to be rescheduled.     _X__ 1. Do not eat food after midnight the night before your procedure.                 No gum chewing or hard candies. You may drink clear liquids up to 2 hours                 before you are scheduled to arrive for your surgery- DO not drink clear                 liquids within 2 hours of the start of your surgery.                 Clear Liquids include:  water, apple juice without pulp, clear carbohydrate                 drink such as Clearfast or Gatorade, Black Coffee or Tea (Do not add                 anything to coffee or tea).  __X__2.  On the morning of surgery brush your teeth with toothpaste and water, you                 may rinse your mouth with mouthwash if you wish.  Do not swallow any              toothpaste of mouthwash.     _X__ 3.  No Alcohol for 24 hours before or after surgery.   _X__ 4.  Do Not Smoke or use e-cigarettes For 24 Hours Prior to Your Surgery.                 Do not use any chewable tobacco products for at least 6 hours prior to                 surgery.  ____  5.  Bring all medications with you on the day of surgery if instructed.   __X__  6.  Notify your doctor if there is any change in your medical condition      (cold, fever, infections).     Do not wear jewelry, make-up, hairpins, clips or nail polish. Do not wear lotions, powders, or perfumes.  Do not shave 48 hours prior to surgery. Men may shave face and neck. Do not bring valuables to the hospital.    [facility] is not responsible for any belongings or  valuables.  Contacts, dentures/partials or body piercings may not be worn into surgery. Bring a case for your contacts, glasses or hearing aids, a denture cup will be supplied. Leave your suitcase in the car. After surgery it may be brought to your room. For patients admitted to the hospital, discharge time is determined by your treatment team.   Patients discharged the day of surgery will not be allowed to drive home.   Please read over the following fact sheets that you were given:   MRSA Information  __X__ Take these medicines the morning of surgery with A SIP OF WATER:    1. none  2.   3.   4.  5.  6.  ____ Fleet Enema (as directed)   ____ Use CHG Soap/SAGE wipes as directed  ____ Use inhalers on the day of surgery  ____ Stop metformin/Janumet/Farxiga 2 days prior to surgery    ____ Take 1/2 of usual insulin dose the night before surgery. No insulin the morning          of surgery.   ____ Stop Blood Thinners Coumadin/Plavix/Xarelto/Pleta/Pradaxa/Eliquis/Effient/Aspirin  on   Or contact your Surgeon, Cardiologist or Medical Doctor regarding  ability to stop your blood thinners  __X__ Stop Anti-inflammatories 7 days before surgery such as Advil, Ibuprofen, Motrin,  BC or Goodies Powder, Naprosyn, Naproxen, Aleve, Aspirin    __X__ Stop all herbal supplements, fish oil or vitamin E until after surgery.    ____ Bring C-Pap to the hospital.

## 2018-02-23 ENCOUNTER — Ambulatory Visit
Admission: RE | Admit: 2018-02-23 | Discharge: 2018-02-23 | Disposition: A | Discharge status: Home / Self Care | Payer: Self-pay | Attending: Obstetrics and Gynecology | Admitting: Obstetrics and Gynecology

## 2018-02-23 ENCOUNTER — Ambulatory Visit: Payer: Self-pay | Admitting: Anesthesiology

## 2018-02-23 ENCOUNTER — Other Ambulatory Visit: Payer: Self-pay

## 2018-02-23 ENCOUNTER — Encounter: Payer: Self-pay | Admitting: *Deleted

## 2018-02-23 ENCOUNTER — Encounter
Admission: RE | Disposition: A | Discharge status: Home / Self Care | Payer: Self-pay | Source: Home / Self Care | Attending: Obstetrics and Gynecology

## 2018-02-23 DIAGNOSIS — Z7982 Long term (current) use of aspirin: Secondary | ICD-10-CM | POA: Insufficient documentation

## 2018-02-23 DIAGNOSIS — D069 Carcinoma in situ of cervix, unspecified: Secondary | ICD-10-CM

## 2018-02-23 DIAGNOSIS — K219 Gastro-esophageal reflux disease without esophagitis: Secondary | ICD-10-CM | POA: Insufficient documentation

## 2018-02-23 DIAGNOSIS — Z9889 Other specified postprocedural states: Secondary | ICD-10-CM

## 2018-02-23 DIAGNOSIS — Z882 Allergy status to sulfonamides status: Secondary | ICD-10-CM | POA: Insufficient documentation

## 2018-02-23 DIAGNOSIS — F17210 Nicotine dependence, cigarettes, uncomplicated: Secondary | ICD-10-CM | POA: Insufficient documentation

## 2018-02-23 DIAGNOSIS — Z87442 Personal history of urinary calculi: Secondary | ICD-10-CM | POA: Insufficient documentation

## 2018-02-23 HISTORY — PX: CERVICAL CONIZATION W/BX: SHX1330

## 2018-02-23 LAB — TYPE AND SCREEN
ABO/RH(D): O NEG
Antibody Screen: NEGATIVE

## 2018-02-23 LAB — POCT PREGNANCY, URINE: Preg Test, Ur: NEGATIVE

## 2018-02-23 SURGERY — CONE BIOPSY, CERVIX
Anesthesia: General

## 2018-02-23 MED ORDER — IBUPROFEN 600 MG PO TABS: 600.0000 mg | ORAL_TABLET | Freq: Four times a day (QID) | ORAL | 3 refills | Status: DC | PRN

## 2018-02-23 MED ORDER — LIDOCAINE-EPINEPHRINE 1 %-1:100000 IJ SOLN
INTRAMUSCULAR | Status: DC | PRN
  Administered 2018-02-23: 15 mL

## 2018-02-23 MED ORDER — LABETALOL HCL 5 MG/ML IV SOLN
INTRAVENOUS | Status: AC
  Administered 2018-02-23: 10 mg via INTRAVENOUS
  Filled 2018-02-23: qty 4

## 2018-02-23 MED ORDER — FENTANYL CITRATE (PF) 100 MCG/2ML IJ SOLN
INTRAMUSCULAR | Status: AC
  Filled 2018-02-23: qty 2

## 2018-02-23 MED ORDER — FERRIC SUBSULFATE 259 MG/GM EX SOLN
CUTANEOUS | Status: DC | PRN
  Administered 2018-02-23: 1 via TOPICAL

## 2018-02-23 MED ORDER — ONDANSETRON HCL 4 MG/2ML IJ SOLN
INTRAMUSCULAR | Status: DC | PRN
  Administered 2018-02-23: 4 mg via INTRAVENOUS

## 2018-02-23 MED ORDER — PROPOFOL 10 MG/ML IV BOLUS
INTRAVENOUS | Status: AC
  Filled 2018-02-23: qty 20

## 2018-02-23 MED ORDER — FENTANYL CITRATE (PF) 100 MCG/2ML IJ SOLN
INTRAMUSCULAR | Status: AC
  Administered 2018-02-23: 25 ug via INTRAVENOUS
  Filled 2018-02-23: qty 2

## 2018-02-23 MED ORDER — LIDOCAINE-EPINEPHRINE 1 %-1:100000 IJ SOLN
INTRAMUSCULAR | Status: AC
  Filled 2018-02-23: qty 1

## 2018-02-23 MED ORDER — FENTANYL CITRATE (PF) 100 MCG/2ML IJ SOLN
INTRAMUSCULAR | Status: DC | PRN
  Administered 2018-02-23 (×2): 50 ug via INTRAVENOUS

## 2018-02-23 MED ORDER — HYDROCODONE-ACETAMINOPHEN 5-325 MG PO TABS: 1.0000 | ORAL_TABLET | ORAL | 0 refills | Status: DC | PRN

## 2018-02-23 MED ORDER — FAMOTIDINE 20 MG PO TABS
ORAL_TABLET | ORAL | Status: AC
  Administered 2018-02-23: 20 mg via ORAL
  Filled 2018-02-23: qty 1

## 2018-02-23 MED ORDER — GLYCOPYRROLATE 0.2 MG/ML IJ SOLN
INTRAMUSCULAR | Status: DC | PRN
  Administered 2018-02-23: 0.2 mg via INTRAVENOUS

## 2018-02-23 MED ORDER — MIDAZOLAM HCL 2 MG/2ML IJ SOLN
INTRAMUSCULAR | Status: DC | PRN
  Administered 2018-02-23: 2 mg via INTRAVENOUS

## 2018-02-23 MED ORDER — DEXAMETHASONE SODIUM PHOSPHATE 10 MG/ML IJ SOLN
INTRAMUSCULAR | Status: DC | PRN
  Administered 2018-02-23: 10 mg via INTRAVENOUS

## 2018-02-23 MED ORDER — IODINE STRONG (LUGOLS) 5 % PO SOLN
ORAL | Status: DC | PRN
  Administered 2018-02-23: 3 mL

## 2018-02-23 MED ORDER — FERRIC SUBSULFATE 259 MG/GM EX SOLN
CUTANEOUS | Status: AC
  Filled 2018-02-23: qty 8

## 2018-02-23 MED ORDER — IODINE STRONG (LUGOLS) 5 % PO SOLN
ORAL | Status: AC
  Filled 2018-02-23: qty 1

## 2018-02-23 MED ORDER — PROPOFOL 10 MG/ML IV BOLUS
INTRAVENOUS | Status: DC | PRN
  Administered 2018-02-23: 180 mg via INTRAVENOUS

## 2018-02-23 MED ORDER — MIDAZOLAM HCL 2 MG/2ML IJ SOLN
INTRAMUSCULAR | Status: AC
  Filled 2018-02-23: qty 2

## 2018-02-23 MED ORDER — LABETALOL HCL 5 MG/ML IV SOLN
10.0000 mg | Freq: Once | INTRAVENOUS | Status: AC
  Administered 2018-02-23: 10 mg via INTRAVENOUS

## 2018-02-23 MED ORDER — ONDANSETRON HCL 4 MG/2ML IJ SOLN: 4.0000 mg | Freq: Once | INTRAMUSCULAR | Status: DC | PRN

## 2018-02-23 MED ORDER — LIDOCAINE HCL (CARDIAC) PF 100 MG/5ML IV SOSY
PREFILLED_SYRINGE | INTRAVENOUS | Status: DC | PRN
  Administered 2018-02-23: 100 mg via INTRAVENOUS

## 2018-02-23 MED ORDER — FAMOTIDINE 20 MG PO TABS
20.0000 mg | ORAL_TABLET | Freq: Once | ORAL | Status: AC
  Administered 2018-02-23: 20 mg via ORAL

## 2018-02-23 MED ORDER — LACTATED RINGERS IV SOLN
INTRAVENOUS | Status: DC
  Administered 2018-02-23: 09:00:00 via INTRAVENOUS

## 2018-02-23 MED ORDER — FENTANYL CITRATE (PF) 100 MCG/2ML IJ SOLN
25.0000 ug | INTRAMUSCULAR | Status: AC | PRN
  Administered 2018-02-23 (×6): 25 ug via INTRAVENOUS

## 2018-02-23 SURGICAL SUPPLY — 29 items
APPLICATOR COTTON TIP 6 STRL (MISCELLANEOUS) ×6 IMPLANT
APPLICATOR COTTON TIP 6IN STRL (MISCELLANEOUS) ×18
BLADE SURG SZ11 CARB STEEL (BLADE) ×3 IMPLANT
CATH ROBINSON RED A/P 16FR (CATHETERS) ×3 IMPLANT
COVER WAND RF STERILE (DRAPES) ×3 IMPLANT
DRAPE PERI LITHO V/GYN (MISCELLANEOUS) ×3 IMPLANT
DRAPE UNDER BUTTOCK W/FLU (DRAPES) ×3 IMPLANT
DRSG TELFA 3X8 NADH (GAUZE/BANDAGES/DRESSINGS) ×3 IMPLANT
ELECT LEEP BALL 5MM 12CM (MISCELLANEOUS) ×3
ELECT REM PT RETURN 9FT ADLT (ELECTROSURGICAL) ×3
ELECTRODE LEEP BALL 5MM 12CM (MISCELLANEOUS) ×1 IMPLANT
ELECTRODE REM PT RTRN 9FT ADLT (ELECTROSURGICAL) ×1 IMPLANT
GAUZE 4X4 16PLY RFD (DISPOSABLE) ×3 IMPLANT
GLOVE BIO SURGEON STRL SZ7 (GLOVE) ×9 IMPLANT
GLOVE INDICATOR 7.5 STRL GRN (GLOVE) ×9 IMPLANT
GOWN STRL REUS W/ TWL LRG LVL3 (GOWN DISPOSABLE) ×2 IMPLANT
GOWN STRL REUS W/TWL LRG LVL3 (GOWN DISPOSABLE) ×4
HEMOSTAT SURGICEL 2X3 (HEMOSTASIS) ×3 IMPLANT
KIT TURNOVER CYSTO (KITS) ×3 IMPLANT
LABEL OR SOLS (LABEL) ×3 IMPLANT
NEEDLE HYPO 25GX1 SAFETY (NEEDLE) ×3 IMPLANT
NEEDLE SPNL 22GX3.5 QUINCKE BK (NEEDLE) ×3 IMPLANT
PACK BASIN MINOR ARMC (MISCELLANEOUS) ×3 IMPLANT
PAD OB MATERNITY 4.3X12.25 (PERSONAL CARE ITEMS) ×3 IMPLANT
PAD PREP 24X41 OB/GYN DISP (PERSONAL CARE ITEMS) ×3 IMPLANT
SUT SILK 2 0 SH (SUTURE) ×3 IMPLANT
SUT VIC AB 0 CT1 36 (SUTURE) ×3 IMPLANT
SYR 10ML LL (SYRINGE) ×3 IMPLANT
SYR CONTROL 10ML (SYRINGE) ×3 IMPLANT

## 2018-02-23 NOTE — Anesthesia Post-op Follow-up Note (Signed)
Anesthesia QCDR form completed.        

## 2018-02-23 NOTE — Anesthesia Procedure Notes (Signed)
Procedure Name: LMA Insertion Date/Time: 02/23/2018 9:26 AM Performed by: Aline Brochure, CRNA Pre-anesthesia Checklist: Patient identified, Emergency Drugs available, Suction available and Patient being monitored Patient Re-evaluated:Patient Re-evaluated prior to induction Oxygen Delivery Method: Circle system utilized Preoxygenation: Pre-oxygenation with 100% oxygen Induction Type: IV induction Ventilation: Mask ventilation without difficulty LMA: LMA inserted LMA Size: 4.0 Number of attempts: 1 Placement Confirmation: positive ETCO2 and breath sounds checked- equal and bilateral Tube secured with: Tape Dental Injury: Teeth and Oropharynx as per pre-operative assessment

## 2018-02-23 NOTE — H&P (Signed)
Obstetrics & Gynecology Surgery H&P    Chief Complaint: Scheduled Surgery   History of Present Illness: Patient is a 37 y.o. G2P2 presenting for scheduled for cold knife conization, for the treatment or further evaluation of cervical dysplaisa.    Preoperative Pap:  12/14/2017 Colposcopy ectocervix negative, endocervix with fragments suspicious for high grade 09/07/17 revealed NIL HPV positive 08/07/2016 Colposcopy negative ectocervical bx and endocervical bx 07/02/2016 NIL HPV positive 07/25/2015 NIL HPV positive Preoperative Endometrial biopsy: N/A   Review of Systems:10 point review of systems  Past Medical History:  Past Medical History:  Diagnosis Date  . Anemia    H/O AS A CHILD  . BRCA negative 08-17-15   MyRisk  . GERD (gastroesophageal reflux disease)    OCC-NO MEDS  . Headache    MIGRAINES  . History of kidney stones     Past Surgical History:  Past Surgical History:  Procedure Laterality Date  . BREAST BIOPSY Right 07-23-15   BENIGN BREAST TISSUE WITH INTRADUCTAL PAPILLOMA AND COLUMNAR CELL   . BREAST BIOPSY Left 2017   NEG  . BREAST EXCISIONAL BIOPSY Right 2017   NEG  . BREAST LUMPECTOMY Right 01/11/2016   Procedure: BREAST LUMPECTOMY;  Surgeon: Christene Lye, MD;  Location: [facility] ORS;  Service: General;  Laterality: Right;  . TUBAL LIGATION    . WISDOM TOOTH EXTRACTION      Family History:  Family History  Problem Relation Age of Onset  . Breast cancer Maternal Aunt        4 mat. aunts 3's and 73's  . Breast cancer Maternal Grandmother   . Breast cancer Cousin        5 maternal cousins    Social History:  Social History   Socioeconomic History  . Marital status: Married    Spouse name: Not on file  . Number of children: Not on file  . Years of education: Not on file  . Highest education level: Not on file  Occupational History  . Not on file  Social Needs  . Financial resource strain: Not on file  . Food insecurity:   Worry: Not on file    Inability: Not on file  . Transportation needs:    Medical: Not on file    Non-medical: Not on file  Tobacco Use  . Smoking status: Current Every Day Smoker    Packs/day: 0.50    Years: 17.00    Pack years: 8.50    Types: Cigarettes  . Smokeless tobacco: Never Used  Substance and Sexual Activity  . Alcohol use: No    Alcohol/week: 0.0 standard drinks  . Drug use: No  . Sexual activity: Yes    Birth control/protection: None  Lifestyle  . Physical activity:    Days per week: Not on file    Minutes per session: Not on file  . Stress: Not on file  Relationships  . Social connections:    Talks on phone: Not on file    Gets together: Not on file    Attends religious service: Not on file    Active member of club or organization: Not on file    Attends meetings of clubs or organizations: Not on file    Relationship status: Not on file  . Intimate partner violence:    Fear of current or ex partner: Not on file    Emotionally abused: Not on file    Physically abused: Not on file    Forced sexual activity: Not  on file  Other Topics Concern  . Not on file  Social History Narrative  . Not on file    Allergies:  Allergies  Allergen Reactions  . Penicillins Anaphylaxis and Other (See Comments)    Has patient had a PCN reaction causing immediate rash, facial/tongue/throat swelling, SOB or lightheadedness with hypotension: Unknown Has patient had a PCN reaction causing severe rash involving mucus membranes or skin necrosis: Unknown Has patient had a PCN reaction that required hospitalization: Unknown Has patient had a PCN reaction occurring within the last 10 years: No If all of the above answers are "NO", then may proceed with Cephalosporin use.   . Sulfa Antibiotics Other (See Comments)    SORES IN MOUTH AND THROAT    Medications: Prior to Admission medications   Medication Sig Start Date End Date Taking? Authorizing Provider    aspirin-acetaminophen-caffeine (EXCEDRIN MIGRAINE) [phone] MG tablet Take 2 tablets by mouth every 6 (six) hours as needed for headache or migraine.    Yes [provider]  norethindrone (AYGESTIN) 5 MG tablet TAKE 1 TABLET BY MOUTH EVERY DAY Patient taking differently: Take 5 mg by mouth daily.  01/11/18  Yes Malachy Mood, MD    Physical Exam Vitals: Blood pressure (!) 140/96, pulse (!) 105, temperature 98.2 F (36.8 C), temperature source Oral, resp. rate 16, height 5' 4.5" (1.638 m), weight 99.8 kg, SpO2 98 %. General: NAD HEENT: normocephalic, anicteric Pulmonary: No increased work of breathing, CTAB Cardiovascular: RRR, distal pulses 2+ Abdomen: soft, non-tender Extremities: no edema, erythema, or tenderness Neurologic: Grossly intact Psychiatric: mood appropriate, affect full  Imaging No results found.  Assessment: 37 y.o. G2P2 presenting for scheduled cold knife conization  Plan: 1) I have had a long and careful discussion with this patient about the pros and cons, risks/benefits of each option available.  She understands that cryotherapy is a minor procedure with approximately a 85% success rate.  She also understands that cone biopsy is outpatient surgery with a 95% success rate and approximately a 5% recurrence rate.  She understands that this is surgery and has all the risks of any surgery including but not limited to the risks of anesthesia, hemorrhage, infection, perforation, injury to bowel, bladder and blood vessels.  She also understands the unlikely but potential effect on both getting pregnant and the ability of carrying a pregnancy.  Hysterectomy is the final option with a 99% success rate.  After careful discussion of all these options and her unique factors, mutual decision has been made to proceed with a  cone biopsy   2) Routine postoperative instructions were reviewed with the patient and her family in detail today including the expected length of  recovery and likely postoperative course.  The patient concurred with the proposed plan, giving informed written consent for the surgery today.  Patient instructed on the importance of being NPO after midnight prior to her procedure.  If warranted preoperative prophylactic antibiotics and SCDs ordered on call to the OR to meet SCIP guidelines and adhere to recommendation laid forth in [place] Number 104 May 2009  "Antibiotic Prophylaxis for Gynecologic Procedures".     Malachy Mood, MD, [facility], [facility] 02/23/2018, 8:55 AM

## 2018-02-23 NOTE — Op Note (Signed)
Preoperative Diagnosis: 1) 37 y.o. with high grade dysplastic changes noted on in office endocervical curettage    Postoperative Diagnosis: 1) 37 y.o. with high grade dysplastic changes noted on in office endocervical curettage   Operation Performed: Cold knife conization of the cervix  Indication:  I have had a long and careful discussion with this patient about the pros and cons, risks/benefits of each option available.  She understands that cryotherapy is a minor procedure with approximately a 85% success rate.  She also understands that cone biopsy is outpatient surgery with a 95% success rate and approximately a 5% recurrence rate.  She understands that this is surgery and has all the risks of any surgery including but not limited to the risks of anesthesia, hemorrhage, infection, perforation, injury to bowel, bladder and blood vessels.  She also understands the unlikely but potential effect on both getting pregnant and the ability of carrying a pregnancy.  Hysterectomy is the final option with a 99% success rate.  After careful discussion of all these options and her unique factors, mutual decision has been made to proceed with a cone biopsy  Pap History: 12/14/2017 Colposcopy ectocervix negative, endocervix with fragments suspicious for high grade 7/8/19revealed negative for intraepithelial lesion or malignancy, HPV positive 08/07/2016 Colposcopy negative ectocervical biopsy and endocervical biopsy 07/02/2016 negative for intraepithelial lesion or malignancy, HPV positive 07/25/2015 negative for intraepithelial lesion or malignancy, HPV positive   Surgeon: Malachy Mood, MD  Anesthesia: General  Preoperative Antibiotics: none  Estimated Blood Loss: 15mL  IV Fluids: 732mL  Urine Output:: 146mL  Drains or Tubes: none  Implants: none  Specimens Removed: none  Complications: none  Intraoperative Findings: Normal appearing cervix, application of Lugol's revealed normal  staining throughout the ectocervix  Patient Condition: stable  Procedure in Detail:  Patient was taken to the operating room where she was administered general anesthesia.  She was positioned in the dorsal lithotomy position utilizing candy cane stirups, prepped and draped in the usual sterile fashion.  Prior to proceeding with procedure a time out was performed.  Attention was turned to the patient's pelvis.  A red rubber catheter was used to empty the patient's bladder.  An operative speculum was placed to allow visualization of the cervix.  Lugol's was applied to the cervix using a soft cotton swab noting the above mentioned staining pattern.  Stay sutures of 0 Vicryl were placed in the vaginal fornix at 10 and 3 O'Clock,  The cervix was injected with 67mL of lidocaine with epinephrine (1:1000).  A uterine sound was placed  delineate the cervical canal.The anterior lip of the cervix was grasped with a single tooth tenaculum, and a circumferential incision was scored in the ectocervix.  The edges of the specimen were grasped with an alice clamp and the incision was carried cephalad circumferentially heading towards the uterine sound.   After excision of the cone specimen endocervical curettage was obtained.  The cone bed was cauterized using a roller ball.  Monsel solution was applied liberally followed by one sheet of fibrillar.  The stay sutures were tied together in the midline to help hold the fibrillar in place.    Sponge needle and instrument counts were correct time two.  The patient tolerated the procedure well and was taken to the recovery room in stable condition.

## 2018-02-23 NOTE — Anesthesia Preprocedure Evaluation (Signed)
Anesthesia Evaluation  Patient identified by MRN, date of birth, ID band Patient awake    Reviewed: Allergy & Precautions, NPO status , Patient's Chart, lab work & pertinent test results  History of Anesthesia Complications Negative for: history of anesthetic complications  Airway Mallampati: II       Dental   Pulmonary Current Smoker,    Pulmonary exam normal        Cardiovascular negative cardio ROS Normal cardiovascular exam     Neuro/Psych  Headaches, negative psych ROS   GI/Hepatic negative GI ROS, Neg liver ROS, GERD  Controlled,  Endo/Other  negative endocrine ROS  Renal/GU negative Renal ROS  Female GU complaint     Musculoskeletal negative musculoskeletal ROS (+)   Abdominal Normal abdominal exam  (+)   Peds negative pediatric ROS (+)  Hematology  (+) anemia ,   Anesthesia Other Findings   Reproductive/Obstetrics                             Anesthesia Physical  Anesthesia Plan  ASA: II  Anesthesia Plan: General   Post-op Pain Management:    Induction:   PONV Risk Score and Plan:   Airway Management Planned: LMA  Additional Equipment:   Intra-op Plan:   Post-operative Plan: Extubation in OR  Informed Consent: I have reviewed the patients History and Physical, chart, labs and discussed the procedure including the risks, benefits and alternatives for the proposed anesthesia with the patient or authorized representative who has indicated his/her understanding and acceptance.     Plan Discussed with:   Anesthesia Plan Comments:         Anesthesia Quick Evaluation

## 2018-02-23 NOTE — Transfer of Care (Signed)
Immediate Anesthesia Transfer of Care Note  Patient: Angie Weber  Procedure(s) Performed: COLD KNIFE CONIZATION CERVIX WITH BIOPSY (N/A )  Patient Location: PACU  Anesthesia Type:General  Level of Consciousness: sedated  Airway & Oxygen Therapy: Patient connected to face mask oxygen  Post-op Assessment: Post -op Vital signs reviewed and stable  Post vital signs: stable  Last Vitals:  Vitals Value Taken Time  BP 124/81 02/23/2018 10:25 AM  Temp 36.5 C 02/23/2018 10:24 AM  Pulse 108 02/23/2018 10:25 AM  Resp 10 02/23/2018 10:25 AM  SpO2 94 % 02/23/2018 10:25 AM    Last Pain:  Vitals:   02/23/18 0815  TempSrc: Oral  PainSc: 0-No pain         Complications: No apparent anesthesia complications

## 2018-02-23 NOTE — Anesthesia Postprocedure Evaluation (Signed)
Anesthesia Post Note  Patient: Angie Weber  Procedure(s) Performed: COLD KNIFE CONIZATION CERVIX WITH BIOPSY (N/A )  Patient location during evaluation: PACU Anesthesia Type: General Level of consciousness: awake and alert and oriented Pain management: pain level controlled Vital Signs Assessment: post-procedure vital signs reviewed and stable Respiratory status: spontaneous breathing Cardiovascular status: blood pressure returned to baseline Anesthetic complications: no     Last Vitals:  Vitals:   02/23/18 1155 02/23/18 1244  BP: 140/89 (!) 133/93  Pulse: 88 86  Resp:  16  Temp:    SpO2: 98% 97%    Last Pain:  Vitals:   02/23/18 1244  TempSrc:   PainSc: 4                  Eliza Grissinger

## 2018-02-23 NOTE — Discharge Instructions (Signed)
  AMBULATORY SURGERY  DISCHARGE INSTRUCTIONS   1) The drugs that you were given will stay in your system until tomorrow so for the next 24 hours you should not:  A) Drive an automobile B) Make any legal decisions C) Drink any alcoholic beverage   2) You may resume regular meals tomorrow.  Today it is better to start with liquids and gradually work up to solid foods.  You may eat anything you prefer, but it is better to start with liquids, then soup and crackers, and gradually work up to solid foods.   3) Please notify your doctor immediately if you have any unusual bleeding, trouble breathing, redness and pain at the surgery site, drainage, fever, or pain not relieved by medication.    4) Additional Instructions: TAKE A STOOL SOFTENER TWICE A DAY WHILE TAKING NARCOTIC PAIN MEDICINE TO PREVENT CONSTIPATION   Please contact your physician with any problems or Same Day Surgery at [phone], Monday through Friday 6 am to 4 pm, or [facility] [place] Main number at [phone].   

## 2018-02-24 ENCOUNTER — Encounter: Payer: Self-pay | Admitting: Obstetrics and Gynecology

## 2018-02-25 LAB — SURGICAL PATHOLOGY

## 2018-03-05 ENCOUNTER — Telehealth: Payer: Self-pay

## 2018-03-05 NOTE — Telephone Encounter (Signed)
FMLA/DISABILITY form for [employer] filled out, signature obtained, and given to [place] for processing.

## 2018-03-11 ENCOUNTER — Telehealth: Payer: Self-pay

## 2018-03-11 NOTE — Telephone Encounter (Signed)
Pt need note for work to start back Jan. 6, 2020.  It can be faxed to [phone].

## 2018-03-11 NOTE — Telephone Encounter (Signed)
Pt aware AMS in office Monday.  Really needs this ASAP b/c she could be locked out of the system and not be able to work.

## 2018-03-11 NOTE — Telephone Encounter (Signed)
This is a  Location manager op patient, AMS is out of the office until 1/13. I can not write a note for her to return to work. Please call her and make her aware when he returns he can write it and we will fax it to number given.

## 2018-03-15 ENCOUNTER — Encounter: Payer: Self-pay | Admitting: Obstetrics and Gynecology

## 2018-03-15 NOTE — Telephone Encounter (Signed)
Should be on her my chart

## 2018-03-15 NOTE — Telephone Encounter (Signed)
Pt aware of note being in her chart. She needs it to actually say may return to work on 1/6. I will ammend it and she will print it off and give to her employer. KJ cma

## 2018-03-17 NOTE — Progress Notes (Signed)
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  This procedure was authorized on 01/05/18 by Erma Pinto [facility] Nurse Consultant, as a diagnostic procedure.  Patient has a follow-up appointment/pap with Dr. Georgianne Fick on 04/06/2018.  If further procedures necessary,  will send [facility] Medicaid application based on CINIII result .  If not, and annual pap is the recommendation, as discussed with Dr. Georgianne Fick, , will schedule annual [facility] eligibility appointment and pap in July 2020.  Copy to [facility].

## 2018-04-05 HISTORY — PX: TOOTH EXTRACTION: SUR596

## 2018-04-06 ENCOUNTER — Encounter: Payer: Self-pay | Admitting: Obstetrics and Gynecology

## 2018-04-06 ENCOUNTER — Ambulatory Visit (INDEPENDENT_AMBULATORY_CARE_PROVIDER_SITE_OTHER): Payer: Self-pay | Admitting: Obstetrics and Gynecology

## 2018-04-06 VITALS — BP 122/74 | HR 93 | Wt 227.0 lb

## 2018-04-06 DIAGNOSIS — Z4889 Encounter for other specified surgical aftercare: Secondary | ICD-10-CM

## 2018-04-08 NOTE — Progress Notes (Signed)
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  Subjective: Patient reports marked improvement in her preop symptoms. Eating a regular diet without difficulty. The patient is not having any pain.  Activity: normal activities of daily living.  Objective: Blood pressure 122/74, pulse 93, weight 227 lb (103 kg).  General: NAD Pulmonary: no increased work of breathing Abdomen: soft, non-tender, non-distended, incision(s) D/C/I GU: normal external female genitalia normal cervix, no CMT, uterus normal in shape and contour, no adnexal tenderness or masses Extremities: no edema Neurologic: normal gait    Admission on 02/23/2018, Discharged on 02/23/2018  Component Date Value Ref Range Status  . ABO/RH(D) 02/23/2018 O NEG   Final  . Antibody Screen 02/23/2018 NEG   Final  . Sample Expiration 02/23/2018    Final                   Value:02/26/2018 Performed at [facility], [address] [place], [place] [address]   . Preg Test, Ur 02/23/2018 NEGATIVE  NEGATIVE Final   Comment:        THE SENSITIVITY OF THIS METHODOLOGY IS >24 mIU/mL   . SURGICAL PATHOLOGY 02/23/2018    Final                   Value:Surgical Pathology CASE: [id number] PATIENT: Angie Weber Surgical Pathology Report     SPECIMEN SUBMITTED: A. Cervical cone; stitch 12:00, free piece 6:00 side B. 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 CERVIX; COLD KNIFE CONE BIOPSY: - HIGH-GRADE SQUAMOUS INTRAEPITHELIAL LESION (HSIL, CIN 3), WITH INVOLVEMENT OF ENDOCERVICAL GLANDS. - ENDOCERVICAL, ECTOCERVICAL, AND STROMAL MARGINS ARE FREE OF HSIL. - FRAGMENT FROM 6:00 HAS UNREMARKABLE SQUAMOUS EPITHELIUM.  B. ENDOCERVIX; CURETTAGE: - EXTREMELY SCANT ENDOCERVICAL TISSUE AND  BLOOD. - NEGATIVE FOR DYSPLASIA.  Comment: The cone biopsy was divided into 12 sections and HSIL is present in 5 sections, from 6:00 to 12:00. HSIL is 2 mm from the closest ectocervical margin and 10 mm from the closest endocervical margin                         .   GROSS DESCRIPTION: A. Labeled: Cervical cone (stitch marks 12:00, free piece is the 6:00 side) Received: Formalin Size: 1.5 x 1.2 x 0.3 cm and 2.7 x 2.0 x 1.2 cm Ectocervix: Received are 2 cervical cone fragments.  The larger fragment displays a suture designating 12:00, and grossly appears to be a full cervical conization specimen.  The larger fragment is received slightly disrupted with tan-pink, grossly unremarkable mucosa.  The smaller fragment is designated as the 6:00 side (per container/requisition) with no further orientation able to be grossly identified.  Minimal tan-pink, grossly unremarkable ectocervical mucosa is identified on the smaller fragment. Endocervix: The larger fragment displays tan-pink, grossly unremarkable endocervical mucosa.  No endocervical mucosa is identified on the smaller fragment.  Inking scheme: The stromal margin of each fragment is inked blue.  The endocervical margin identified on the larger fragment only is inked black.                           Block summary: 1 - 12 to 3 o'clock (larger fragment) 2 - 3 to 6 o'clock (larger fragment) 3 -  6 to 9 o'clock (larger fragment) 4 - 9 to 12 o'clock (larger fragment) 5 - smaller fragment (trisected perpendicular to minimal ectocervical mucosa)  B. Labeled: Endocervical curettings Received: Formalin Tissue fragment(s): Multiple Size: Aggregate, 0.3 x 0.2 by less than 0.1 cm Description: Mucinous/hemorrhagic material.  Filtered into a mesh bag. Entirely submitted in 1 cassette.   Final Diagnosis performed by Bryan Lemma, MD.   Electronically signed 02/25/2018 10:34:37AM The electronic signature indicates that the named Attending  Pathologist has evaluated the specimen  Technical component performed at [facility], [address] [place], [place] [address] Lab: [phone] Dir: Rush Farmer, MD, MMM  Professional component performed at [facility], [facility], [place], [place], [place] [address] Lab: [phone] Dir: Dellia Nims. Rubinas, MD     Assessment: 38 y.o. s/p CKC stable  Plan: Patient has done well after surgery with no apparent complications.  I have discussed the post-operative course to date, and the expected progress moving forward.  The patient understands what complications to be concerned about.  I will see the patient in routine follow up, or sooner if needed.    Activity plan: No restriction.  Return in about 6 months (around 10/05/2018) for 6 month follow up 12 months annual.    Malachy Mood, MD, [place], [place]

## 2018-04-16 NOTE — Progress Notes (Signed)
Scheduled patient to return on 09/20/2018 at 1:30 to resume BCCCP eligibility.  Needs to fill out paper work only.  She has follow-up from cervical conization with Dr. Georgianne Fick on 10/05/2018.   If she requires further treatment at that time, will look at [facility] , based on CINIII pathology from conization.  Mailed appointment information to patient.

## 2018-09-20 ENCOUNTER — Ambulatory Visit: Payer: Self-pay

## 2018-09-21 ENCOUNTER — Ambulatory Visit
Admission: RE | Admit: 2018-09-21 | Discharge: 2018-09-21 | Disposition: A | Discharge status: Home / Self Care | Payer: Self-pay | Source: Ambulatory Visit | Attending: Oncology | Admitting: Oncology

## 2018-09-21 ENCOUNTER — Ambulatory Visit: Payer: Self-pay | Attending: Oncology

## 2018-09-21 ENCOUNTER — Other Ambulatory Visit: Payer: Self-pay

## 2018-09-21 VITALS — BP 124/86 | HR 99 | Temp 99.5°F | Ht 64.0 in | Wt 225.6 lb

## 2018-09-21 DIAGNOSIS — Z0000 Encounter for general adult medical examination without abnormal findings: Secondary | ICD-10-CM | POA: Insufficient documentation

## 2018-09-21 NOTE — Progress Notes (Signed)
38 year old patient returns to renew BCCCP eligibility.  She has a conization follow-up appointment with Dr. Georgianne Fick on 10/05/2018 .  He will perform her pap at that time.  Patient has had negative genetic testing, but a strong family history of breast cancer.  Sent for a screening mammogram through [employer].

## 2018-10-05 ENCOUNTER — Encounter: Payer: Self-pay | Admitting: Obstetrics and Gynecology

## 2018-10-05 ENCOUNTER — Other Ambulatory Visit: Payer: Self-pay

## 2018-10-05 ENCOUNTER — Other Ambulatory Visit (HOSPITAL_COMMUNITY)
Admission: RE | Admit: 2018-10-05 | Discharge: 2018-10-05 | Disposition: A | Discharge status: Home / Self Care | Payer: Self-pay | Source: Ambulatory Visit | Attending: Obstetrics and Gynecology | Admitting: Obstetrics and Gynecology

## 2018-10-05 ENCOUNTER — Ambulatory Visit (INDEPENDENT_AMBULATORY_CARE_PROVIDER_SITE_OTHER): Payer: Self-pay | Admitting: Obstetrics and Gynecology

## 2018-10-05 VITALS — BP 128/74 | HR 89 | Wt 221.0 lb

## 2018-10-05 DIAGNOSIS — N879 Dysplasia of cervix uteri, unspecified: Secondary | ICD-10-CM | POA: Insufficient documentation

## 2018-10-05 DIAGNOSIS — Z124 Encounter for screening for malignant neoplasm of cervix: Secondary | ICD-10-CM | POA: Insufficient documentation

## 2018-10-05 NOTE — Progress Notes (Signed)
Obstetrics & Gynecology Office Visit   Chief Complaint:  Chief Complaint  Patient presents with  . Follow-up    Repeat pap/ LEEP in 01/2018    History of Present Illness:Angie Weber is a 38 y.o. woman who presents today for continued surveillance for history of dysplasia. Last pap obtained on 09/07/17 revealed NIL HPV positive.  Prior colposcopy suspicious for high grade changes on ECC.  CKC 02/23/2018 CIN III endocervical gland involvement    Pap/Treatment History:  12/24/2017 CKC CIN III 12/14/2017 Colposcopy ectocervix negative, endocervix with fragments suspicious for high grade 7/8/19revealed NIL HPV positive 08/07/2016 Colposcopy negative ectocervical bx and endocervical bx 07/02/2016 NIL HPV positive 07/25/2015 NIL HPV positive  Review of Systems: Review of Systems  Constitutional: Negative.   Skin: Negative.    Past Medical History:  Past Medical History:  Diagnosis Date  . Anemia    H/O AS A CHILD  . BRCA negative 08-17-15   MyRisk  . GERD (gastroesophageal reflux disease)    OCC-NO MEDS  . Headache    MIGRAINES  . History of kidney stones     Past Surgical History:  Past Surgical History:  Procedure Laterality Date  . BREAST BIOPSY Right 07-23-15   BENIGN BREAST TISSUE WITH INTRADUCTAL PAPILLOMA AND COLUMNAR CELL   . BREAST BIOPSY Left 2017   NEG  . BREAST EXCISIONAL BIOPSY Right 2017   NEG  . BREAST LUMPECTOMY Right 01/11/2016   Procedure: BREAST LUMPECTOMY;  Surgeon: Christene Lye, MD;  Location: [facility] ORS;  Service: General;  Laterality: Right;  . CERVICAL CONIZATION W/BX N/A 02/23/2018   Procedure: COLD KNIFE CONIZATION CERVIX WITH BIOPSY;  Surgeon: Malachy Mood, MD;  Location: [facility] ORS;  Service: Gynecology;  Laterality: N/A;  . TOOTH EXTRACTION  04/05/2018  . TUBAL LIGATION    . WISDOM TOOTH EXTRACTION      Gynecologic History: Patient's last menstrual period was 09/28/2018 (exact date).  Obstetric History: G2P2  Family  History:  Family History  Problem Relation Age of Onset  . Breast cancer Maternal Aunt        4 mat. aunts 5's and 71's  . Breast cancer Maternal Grandmother   . Breast cancer Cousin        5 maternal cousins  . Breast cancer Maternal Aunt   . Breast cancer Maternal Aunt   . Breast cancer Maternal Aunt   . Breast cancer Maternal Aunt     Social History:  Social History   Socioeconomic History  . Marital status: Married    Spouse name: Not on file  . Number of children: Not on file  . Years of education: Not on file  . Highest education level: Not on file  Occupational History  . Not on file  Social Needs  . Financial resource strain: Not on file  . Food insecurity    Worry: Not on file    Inability: Not on file  . Transportation needs    Medical: Not on file    Non-medical: Not on file  Tobacco Use  . Smoking status: Current Every Day Smoker    Packs/day: 0.50    Years: 17.00    Pack years: 8.50    Types: Cigarettes  . Smokeless tobacco: Never Used  Substance and Sexual Activity  . Alcohol use: No    Alcohol/week: 0.0 standard drinks  . Drug use: No  . Sexual activity: Yes    Birth control/protection: None, Pill  Lifestyle  .  Physical activity    Days per week: Not on file    Minutes per session: Not on file  . Stress: Not on file  Relationships  . Social Herbalist on phone: Not on file    Gets together: Not on file    Attends religious service: Not on file    Active member of club or organization: Not on file    Attends meetings of clubs or organizations: Not on file    Relationship status: Not on file  . Intimate partner violence    Fear of current or ex partner: Not on file    Emotionally abused: Not on file    Physically abused: Not on file    Forced sexual activity: Not on file  Other Topics Concern  . Not on file  Social History Narrative  . Not on file    Allergies:  Allergies  Allergen Reactions  . Penicillins Anaphylaxis  and Other (See Comments)    Has patient had a PCN reaction causing immediate rash, facial/tongue/throat swelling, SOB or lightheadedness with hypotension: Unknown Has patient had a PCN reaction causing severe rash involving mucus membranes or skin necrosis: Unknown Has patient had a PCN reaction that required hospitalization: Unknown Has patient had a PCN reaction occurring within the last 10 years: No If all of the above answers are "NO", then may proceed with Cephalosporin use.   . Sulfa Antibiotics Other (See Comments)    SORES IN MOUTH AND THROAT    Medications: Prior to Admission medications   Medication Sig Start Date End Date Taking? Authorizing Provider  norethindrone (AYGESTIN) 5 MG tablet TAKE 1 TABLET BY MOUTH EVERY DAY Patient taking differently: Take 5 mg by mouth daily.  01/11/18  Yes Malachy Mood, MD    Physical Exam Vitals:  Vitals:   10/05/18 0813  BP: 128/74  Pulse: 89   Patient's last menstrual period was 09/28/2018 (exact date).  General: NAD, well nourished, appears stated age 44: normocephalic, anicteric Pulmonary: No increased work of breathing Genitourinary:  External: Normal external female genitalia.  Normal urethral meatus, normal  Bartholin's and Skene's glands.    Vagina: Normal vaginal mucosa, no evidence of prolapse.    Cervix: Grossly normal in appearance, no bleeding  Uterus: Non-enlarged, mobile, normal contour.  No CMT  Adnexa: ovaries non-enlarged, no adnexal masses  Rectal: deferred  Lymphatic: no evidence of inguinal lymphadenopathy Extremities: no edema, erythema, or tenderness Neurologic: Grossly intact Psychiatric: mood appropriate, affect full  Female chaperone present for pelvic and breast  portions of the physical exam     Assessment: 38 y.o. G2P2 follow up for cervical dysplaisa  Plan: Problem List Items Addressed This Visit      Genitourinary   Cervical dysplasia    Other Visit Diagnoses    Screening for  malignant neoplasm of cervix    -  Primary   Relevant Orders   Cytology - PAP      - Follow up pap smear from today.   - I had a lengthly discussion with [name]  regarding the cause of dysplasia of the lower genital tract (including immunosuppression in the setting of HPV exposure and tobacco exposure). I explained the potential for progression to invasive malignancy, the recurrent nature of these lesions (and the need for close continued followup). Results of today's pap will dictate need for further evaluation and follow up per ASCCP guidelines..  - She is comfortable with the plan and had her questions  answered.  - Return in about 1 year (around 10/05/2019) for annual.   Malachy Mood, MD, [place], [facility] 10/05/2018, 8:29 AM

## 2018-10-07 LAB — CYTOLOGY - PAP
Adequacy: ABSENT
Diagnosis: NEGATIVE
HPV: NOT DETECTED

## 2018-11-08 NOTE — Progress Notes (Signed)
Letter mailed to patient to notify of normal mammogram, and pap smear results. Per Dr. Georgianne Fick, repeat pap in one year.  Patient to return in one year for annual screening.  Copy to [facility].

## 2019-01-17 ENCOUNTER — Other Ambulatory Visit: Payer: Self-pay | Admitting: Obstetrics and Gynecology

## 2019-01-17 MED ORDER — NORETHINDRONE ACETATE 5 MG PO TABS: 5.0000 mg | ORAL_TABLET | Freq: Every day | ORAL | 11 refills | Status: DC

## 2019-09-27 IMAGING — MG DIGITAL SCREENING BILATERAL MAMMOGRAM WITH TOMO AND CAD
8 series · 8 of 24 positions shown · non-contrast
Comparison: Previous exam(s).

CLINICAL DATA: Screening.

EXAM:
DIGITAL SCREENING BILATERAL MAMMOGRAM WITH TOMO AND CAD

[R CC synth-2D]
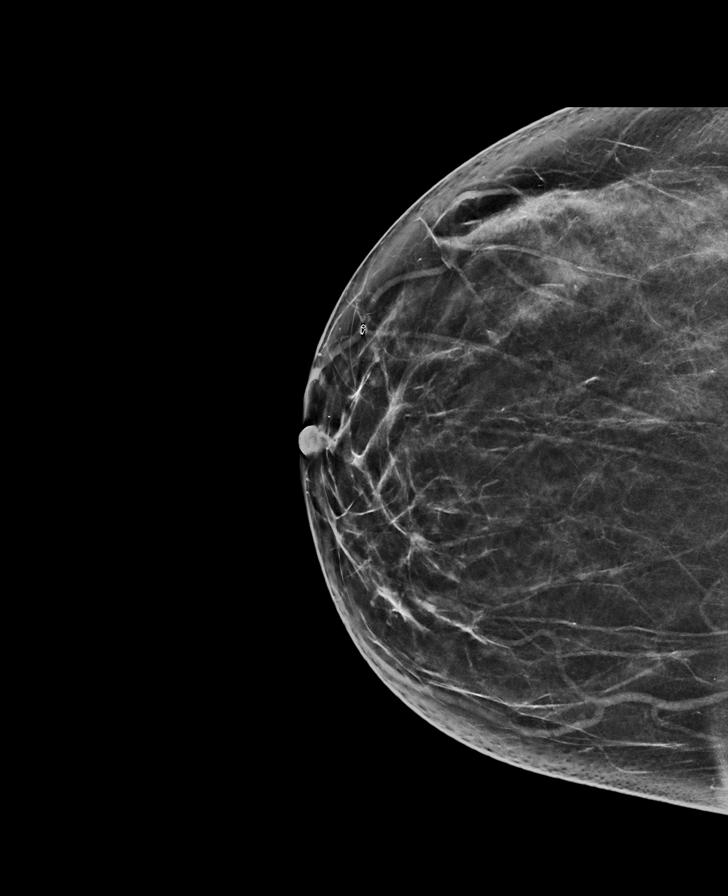

[R MLO synth-2D]
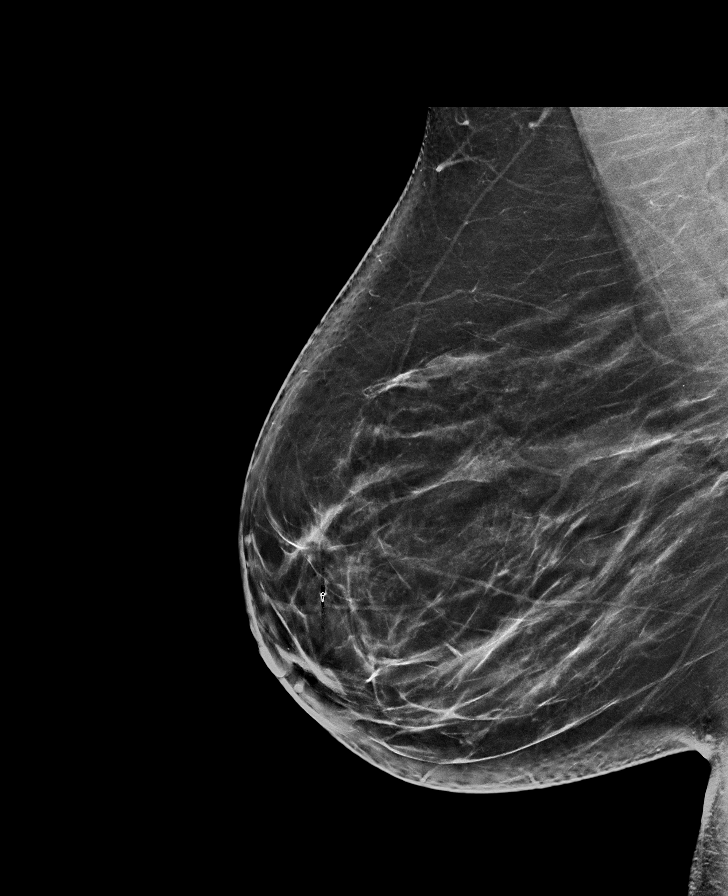

[L MLO synth-2D]
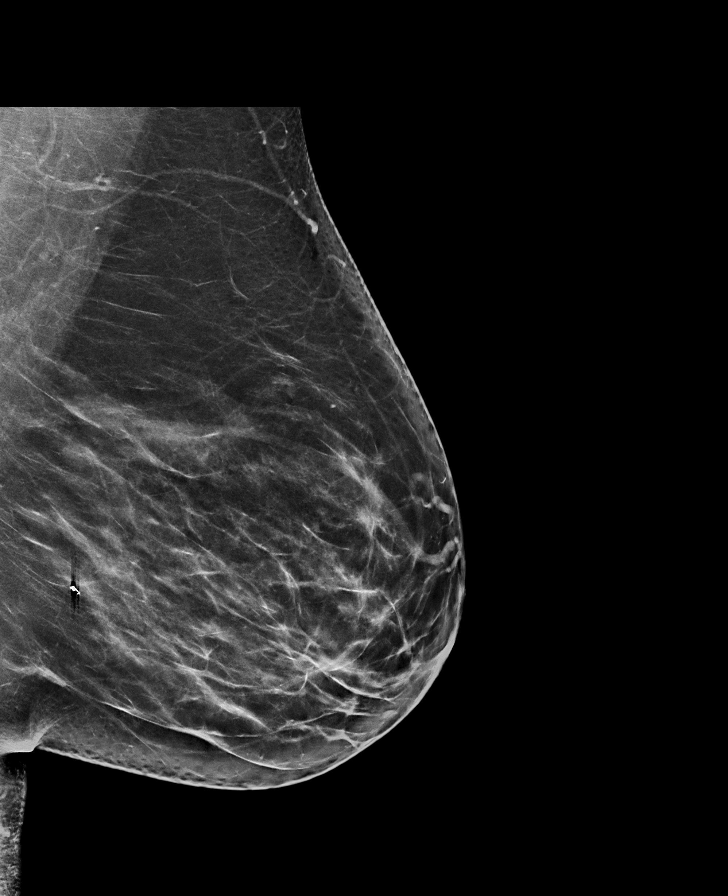

[L CC synth-2D]
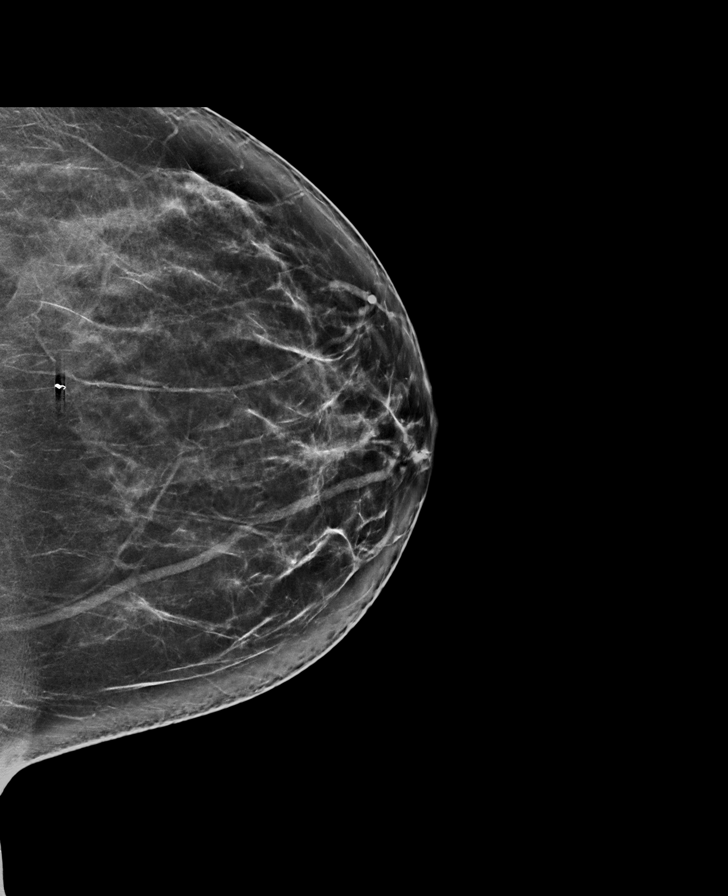

[L MLO tomo · tomo slice 43/86.0]
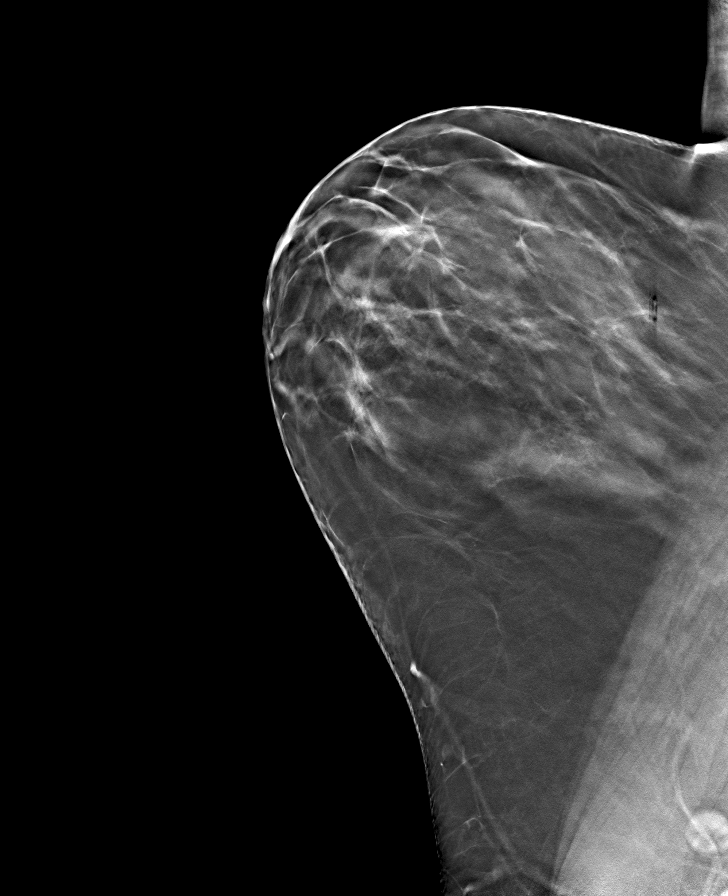

[R CC tomo · tomo slice 37/73.0]
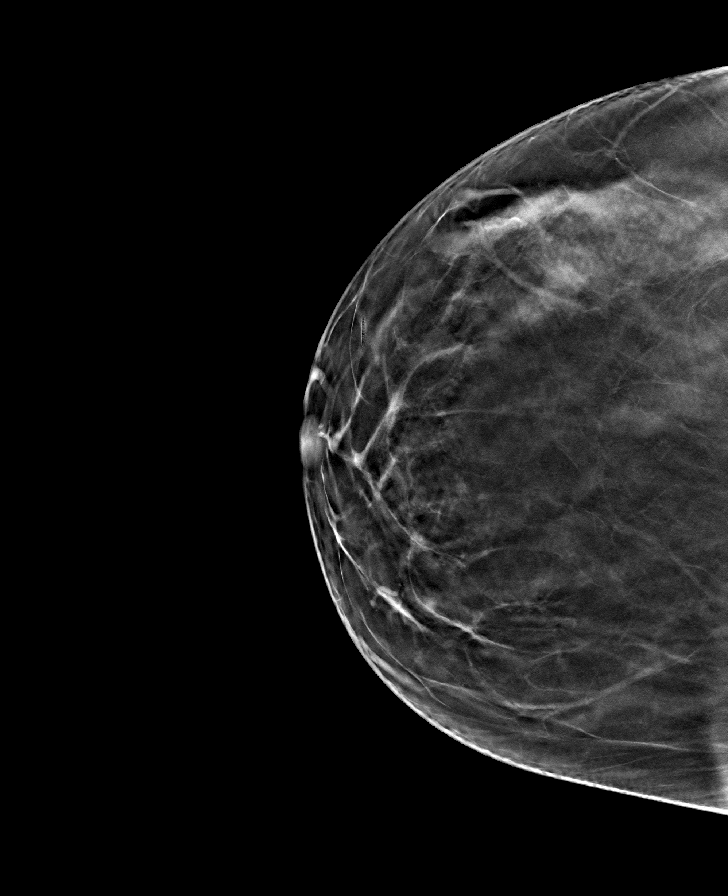

[L CC tomo · tomo slice 41/80.0]
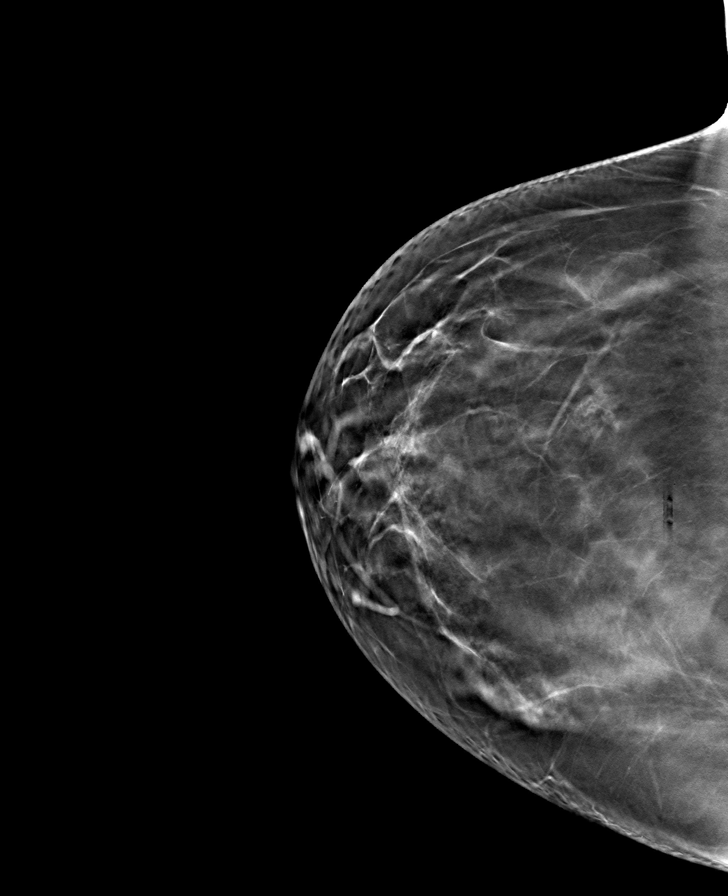

[R MLO tomo · tomo slice 43/84.0]
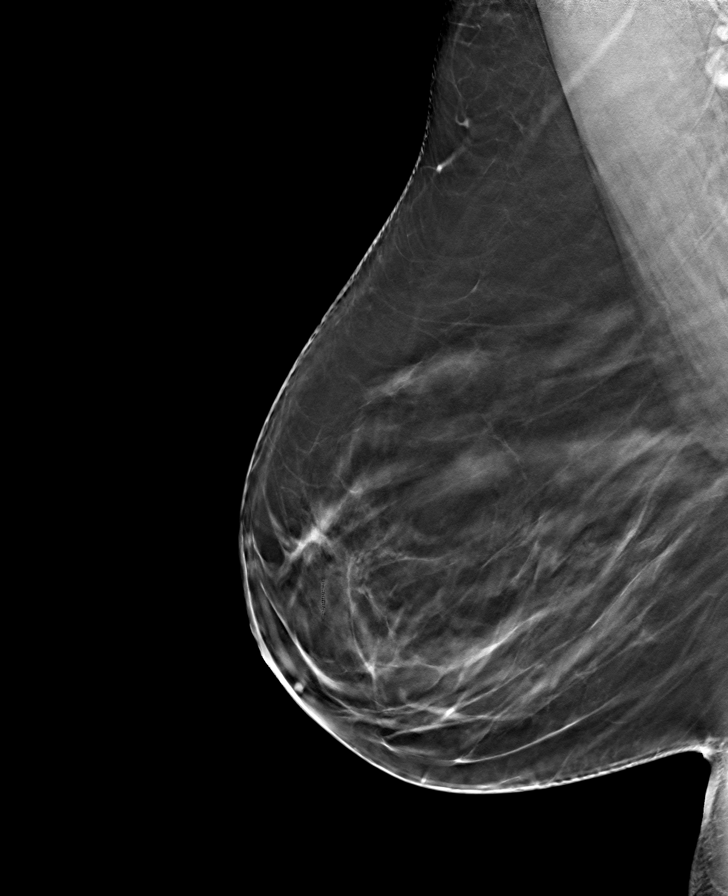

[8 of 24 positions shown; findings below may reference images not displayed]

ACR Breast Density Category c: The breast tissue is heterogeneously
dense, which may obscure small masses.
FINDINGS: There are no findings suspicious for malignancy. Images were
processed with CAD.
IMPRESSION: No mammographic evidence of malignancy. A result letter of this
screening mammogram will be mailed directly to the patient.

RECOMMENDATION:
Screening mammogram at age 40. (Code:KQ-8-OP1)

BI-RADS CATEGORY  1: Negative.

## 2020-02-15 ENCOUNTER — Other Ambulatory Visit: Payer: Self-pay | Admitting: Obstetrics and Gynecology

## 2020-02-17 ENCOUNTER — Telehealth: Payer: Self-pay

## 2020-02-17 NOTE — Telephone Encounter (Signed)
Pt calling; is unable to refill bc, norethindrone, thru MyChart; needs refill.  [phone]

## 2020-02-20 MED ORDER — NORETHINDRONE ACETATE 5 MG PO TABS: 5.0000 mg | ORAL_TABLET | Freq: Every day | ORAL | 1 refills | Status: DC

## 2020-02-20 NOTE — Addendum Note (Signed)
Addended by: Cleophas Dunker D on: 02/20/2020 01:54 PM   Modules accepted: Orders

## 2020-02-20 NOTE — Telephone Encounter (Signed)
Pt aware refill eRx'd. 

## 2020-02-20 NOTE — Telephone Encounter (Signed)
Patient is scheduled for 03/23/20 at 10 :60 with AMS

## 2020-03-15 ENCOUNTER — Other Ambulatory Visit: Payer: Self-pay | Admitting: Obstetrics and Gynecology

## 2020-03-23 ENCOUNTER — Ambulatory Visit: Payer: Self-pay | Admitting: Obstetrics and Gynecology

## 2020-04-12 ENCOUNTER — Telehealth: Payer: Self-pay | Admitting: Obstetrics and Gynecology

## 2020-04-12 NOTE — Telephone Encounter (Signed)
Needs appointment hasn't been seen since 10/2018 I refilled once this year now further refills

## 2020-04-13 NOTE — Telephone Encounter (Signed)
Called nad scheduled patient for 05/02/20. Patient reported having COVID so had to cancel appointment scheduled in January. Patient is requesting refill to get to her scheduled appointment

## 2020-05-02 ENCOUNTER — Encounter: Payer: Self-pay | Admitting: Obstetrics and Gynecology

## 2020-05-02 ENCOUNTER — Other Ambulatory Visit: Payer: Self-pay

## 2020-05-02 ENCOUNTER — Ambulatory Visit (INDEPENDENT_AMBULATORY_CARE_PROVIDER_SITE_OTHER): Payer: Self-pay | Admitting: Obstetrics and Gynecology

## 2020-05-02 ENCOUNTER — Other Ambulatory Visit (HOSPITAL_COMMUNITY)
Admission: RE | Admit: 2020-05-02 | Discharge: 2020-05-02 | Disposition: A | Discharge status: Home / Self Care | Payer: Self-pay | Source: Ambulatory Visit | Attending: Obstetrics and Gynecology | Admitting: Obstetrics and Gynecology

## 2020-05-02 VITALS — BP 130/84 | Ht 64.5 in | Wt 215.0 lb

## 2020-05-02 DIAGNOSIS — Z1239 Encounter for other screening for malignant neoplasm of breast: Secondary | ICD-10-CM

## 2020-05-02 DIAGNOSIS — B354 Tinea corporis: Secondary | ICD-10-CM

## 2020-05-02 DIAGNOSIS — Z124 Encounter for screening for malignant neoplasm of cervix: Secondary | ICD-10-CM

## 2020-05-02 DIAGNOSIS — Z3041 Encounter for surveillance of contraceptive pills: Secondary | ICD-10-CM

## 2020-05-02 DIAGNOSIS — Z01419 Encounter for gynecological examination (general) (routine) without abnormal findings: Secondary | ICD-10-CM

## 2020-05-02 MED ORDER — NORETHINDRONE ACETATE 5 MG PO TABS: 5.0000 mg | ORAL_TABLET | Freq: Every day | ORAL | 3 refills | Status: DC

## 2020-05-02 MED ORDER — NYSTATIN 100000 UNIT/GM EX CREA: 1.0000 "application " | TOPICAL_CREAM | Freq: Two times a day (BID) | CUTANEOUS | 1 refills | Status: DC

## 2020-05-02 NOTE — Progress Notes (Signed)
Gynecology Annual Exam   PCP: Patient, No Pcp Per  Chief Complaint:  Chief Complaint  Patient presents with  . Gynecologic Exam    Annual - refill OCP.  RM  5    History of Present Illness: Patient is a 40 y.o. G2P2 presents for annual exam. The patient has no complaints today.   LMP: Patient's last menstrual period was 04/18/2020. Amenorrhea while on norethindrone  The patient is sexually active. She currently uses oral progesterone-only contraceptive for contraception. She denies dyspareunia.  The patient does perform self breast exams.  There is no notable family history of breast or ovarian cancer in her family.  The patient wears seatbelts: yes.   The patient has regular exercise: not asked.    The patient denies current symptoms of depression.    Has noted some nipple pruritus  Review of Systems: Review of Systems  Constitutional: Negative for chills and fever.  HENT: Negative for congestion.   Respiratory: Negative for cough and shortness of breath.   Cardiovascular: Negative for chest pain and palpitations.  Gastrointestinal: Negative for abdominal pain, constipation, diarrhea, heartburn, nausea and vomiting.  Genitourinary: Negative for dysuria, frequency and urgency.  Skin: Positive for itching. Negative for rash.  Neurological: Negative for dizziness and headaches.  Endo/Heme/Allergies: Negative for polydipsia.  Psychiatric/Behavioral: Negative for depression.    Past Medical History:  Patient Active Problem List   Diagnosis Date Noted  . Cervical dysplasia 10/05/2018    12/24/2017 CKC CIN III clear margins 12/14/2017 Colposcopy ectocervix negative, endocervix with fragments suspicious for high grade 7/8/19revealed NIL HPV positive 08/07/2016 Colposcopy negative ectocervical bx and endocervical bx 07/02/2016 NIL HPV positive 07/25/2015 NIL HPV positive   . Pilonidal cyst 09/08/2017    Past Surgical History:  Past Surgical History:  Procedure  Laterality Date  . BREAST BIOPSY Right 07-23-15   BENIGN BREAST TISSUE WITH INTRADUCTAL PAPILLOMA AND COLUMNAR CELL   . BREAST BIOPSY Left 2017   NEG  . BREAST EXCISIONAL BIOPSY Right 2017   NEG  . BREAST LUMPECTOMY Right 01/11/2016   Procedure: BREAST LUMPECTOMY;  Surgeon: Christene Lye, MD;  Location: [facility] ORS;  Service: General;  Laterality: Right;  . CERVICAL CONIZATION W/BX N/A 02/23/2018   Procedure: COLD KNIFE CONIZATION CERVIX WITH BIOPSY;  Surgeon: Malachy Mood, MD;  Location: [facility] ORS;  Service: Gynecology;  Laterality: N/A;  . TOOTH EXTRACTION  04/05/2018  . TUBAL LIGATION    . WISDOM TOOTH EXTRACTION      Gynecologic History:  Patient's last menstrual period was 04/18/2020. Contraception: oral progesterone-only contraceptive Last Pap: Results were: 10/05/2018 NIL and HR HPV negative  12/24/2017 CKC CIN III 12/14/2017 Colposcopy ectocervix negative, endocervix with fragments suspicious for high grade 7/8/19revealed NIL HPV positive 08/07/2016 Colposcopy negative ectocervical bx and endocervical bx 07/02/2016 NIL HPV positive 05/24/2017NIL HPV positive   Obstetric History: G2P2  Family History:  Family History  Problem Relation Age of Onset  . Breast cancer Maternal Aunt        4 mat. aunts 90's and 97's  . Breast cancer Maternal Grandmother   . Breast cancer Cousin        5 maternal cousins  . Breast cancer Maternal Aunt   . Breast cancer Maternal Aunt   . Breast cancer Maternal Aunt   . Breast cancer Maternal Aunt     Social History:  Social History   Socioeconomic History  . Marital status: Married    Spouse name: Not on file  .  Number of children: Not on file  . Years of education: Not on file  . Highest education level: Not on file  Occupational History  . Not on file  Tobacco Use  . Smoking status: Former Smoker    Packs/day: 0.50    Years: 17.00    Pack years: 8.50    Types: Cigarettes  . Smokeless tobacco: Never Used  Vaping  Use  . Vaping Use: Never used  Substance and Sexual Activity  . Alcohol use: No    Alcohol/week: 0.0 standard drinks  . Drug use: No  . Sexual activity: Yes    Birth control/protection: Pill  Other Topics Concern  . Not on file  Social History Narrative  . Not on file   Social Determinants of Health   Financial Resource Strain: Not on file  Food Insecurity: Not on file  Transportation Needs: Not on file  Physical Activity: Not on file  Stress: Not on file  Social Connections: Not on file  Intimate Partner Violence: Not on file    Allergies:  Allergies  Allergen Reactions  . Penicillins Anaphylaxis and Other (See Comments)    Has patient had a PCN reaction causing immediate rash, facial/tongue/throat swelling, SOB or lightheadedness with hypotension: Unknown Has patient had a PCN reaction causing severe rash involving mucus membranes or skin necrosis: Unknown Has patient had a PCN reaction that required hospitalization: Unknown Has patient had a PCN reaction occurring within the last 10 years: No If all of the above answers are "NO", then may proceed with Cephalosporin use.   . Sulfa Antibiotics Other (See Comments)    SORES IN MOUTH AND THROAT    Medications: Prior to Admission medications   Medication Sig Start Date End Date Taking? Authorizing Provider  norethindrone (AYGESTIN) 5 MG tablet TAKE 1 TABLET (5 MG TOTAL) BY MOUTH DAILY. 03/15/20   Malachy Mood, MD    Physical Exam Vitals: Blood pressure 130/84, height 5' 4.5" (1.638 m), weight 215 lb (97.5 kg), last menstrual period 04/18/2020.   General: NAD HEENT: normocephalic, anicteric Thyroid: no enlargement, no palpable nodules Pulmonary: No increased work of breathing, CTAB Cardiovascular: RRR, distal pulses 2+ Breast: Breast symmetrical, no tenderness, no palpable nodules or masses, no skin or nipple retraction present, no nipple discharge.  No axillary or supraclavicular lymphadenopathy.  ON both  areolas there are linear raised thickened areas of skin with some mild scaling corresponding to the area reported as being pruritic.  These are red, non-tender.  Abdomen: NABS, soft, non-tender, non-distended.  Umbilicus without lesions.  No hepatomegaly, splenomegaly or masses palpable. No evidence of hernia  Genitourinary:  External: Normal external female genitalia.  Normal urethral meatus, normal Bartholin's and Skene's glands.    Vagina: Normal vaginal mucosa, no evidence of prolapse.    Cervix: Grossly normal in appearance, no bleeding  Uterus: Non-enlarged, mobile, normal contour.  No CMT  Adnexa: ovaries non-enlarged, no adnexal masses  Rectal: deferred  Lymphatic: no evidence of inguinal lymphadenopathy Extremities: no edema, erythema, or tenderness Neurologic: Grossly intact Psychiatric: mood appropriate, affect full  Female chaperone present for pelvic and breast  portions of the physical exam    Assessment: 40 y.o. G2P2 routine annual exam  Plan: Problem List Items Addressed This Visit   None   Visit Diagnoses    Screening for malignant neoplasm of cervix    -  Primary   Breast screening       Encounter for gynecological examination without abnormal finding  Surveillance for birth control, oral contraceptives       Tinea corporis       Relevant Medications   nystatin cream (MYCOSTATIN)      1) STI screening  was notoffered and therefore not obtained  2)  ASCCP guidelines and rational discussed.  Patient opts for yearly screening interval.  If normal this year may extend screening interval as second negative pap post CIN III  3) Contraception - the patient is currently using  oral progesterone-only contraceptive.  She is happy with her current form of contraception and plans to continue - norethindrone for pelvic pain, dysmenorrhea  4) Routine healthcare maintenance including cholesterol, diabetes screening discussed managed by PCP  5) Tinea corporis  -  trial of nystatin - if fails to improve eczema also in the differential and discussed trial of triamcinolone if that was the case following a week of treatment.  7) Return in about 1 year (around 05/02/2021) for annual.   Malachy Mood, MD, [place], [facility] 05/02/2020, 1:38 PM

## 2020-05-03 NOTE — Telephone Encounter (Signed)
Patient was seen 05/02/20 with AMS

## 2020-05-03 NOTE — Addendum Note (Signed)
Addended by: Dorthula Nettles on: 05/03/2020 12:52 PM   Modules accepted: Orders

## 2020-05-07 LAB — CYTOLOGY - PAP
Adequacy: ABSENT
Comment: NEGATIVE
Diagnosis: NEGATIVE
High risk HPV: NEGATIVE

## 2020-06-01 ENCOUNTER — Other Ambulatory Visit: Payer: Self-pay | Admitting: Obstetrics and Gynecology

## 2020-06-01 DIAGNOSIS — R21 Rash and other nonspecific skin eruption: Secondary | ICD-10-CM

## 2021-03-24 ENCOUNTER — Other Ambulatory Visit: Payer: Self-pay | Admitting: Obstetrics and Gynecology

## 2021-03-24 DIAGNOSIS — Z3041 Encounter for surveillance of contraceptive pills: Secondary | ICD-10-CM

## 2021-04-15 NOTE — Telephone Encounter (Signed)
Patient scheduled AE 05/08/21. Will run out of birth control before then. Requesting refill. [phone]

## 2021-04-18 NOTE — Telephone Encounter (Signed)
Patient aware.

## 2021-04-18 NOTE — Telephone Encounter (Signed)
[place] rf failed to send to pharmacy. Spoke w/Pharmacist. Verbal given for 1 pack to get to 05/08/21 apt.

## 2021-04-18 NOTE — Telephone Encounter (Signed)
This encounter was created in error - please disregard.

## 2021-04-18 NOTE — Telephone Encounter (Signed)
April 15, 2021     10:47 AM Note Patient scheduled AE 05/08/21. Will run out of birth control before then. Requesting refill. [phone]

## 2021-05-08 ENCOUNTER — Other Ambulatory Visit: Payer: Self-pay

## 2021-05-08 ENCOUNTER — Encounter: Payer: Self-pay | Admitting: Obstetrics and Gynecology

## 2021-05-08 ENCOUNTER — Other Ambulatory Visit (HOSPITAL_COMMUNITY)
Admission: RE | Admit: 2021-05-08 | Discharge: 2021-05-08 | Disposition: A | Discharge status: Home / Self Care | Payer: Self-pay | Source: Ambulatory Visit | Attending: Obstetrics and Gynecology | Admitting: Obstetrics and Gynecology

## 2021-05-08 ENCOUNTER — Ambulatory Visit (INDEPENDENT_AMBULATORY_CARE_PROVIDER_SITE_OTHER): Payer: Self-pay | Admitting: Obstetrics and Gynecology

## 2021-05-08 VITALS — BP 122/90 | Ht 64.5 in | Wt 230.0 lb

## 2021-05-08 DIAGNOSIS — Z1151 Encounter for screening for human papillomavirus (HPV): Secondary | ICD-10-CM

## 2021-05-08 DIAGNOSIS — Z1231 Encounter for screening mammogram for malignant neoplasm of breast: Secondary | ICD-10-CM

## 2021-05-08 DIAGNOSIS — D069 Carcinoma in situ of cervix, unspecified: Secondary | ICD-10-CM

## 2021-05-08 DIAGNOSIS — Z124 Encounter for screening for malignant neoplasm of cervix: Secondary | ICD-10-CM

## 2021-05-08 DIAGNOSIS — Z01419 Encounter for gynecological examination (general) (routine) without abnormal findings: Secondary | ICD-10-CM

## 2021-05-08 DIAGNOSIS — Z803 Family history of malignant neoplasm of breast: Secondary | ICD-10-CM

## 2021-05-08 DIAGNOSIS — Z3041 Encounter for surveillance of contraceptive pills: Secondary | ICD-10-CM

## 2021-05-08 MED ORDER — NORETHINDRONE ACETATE 5 MG PO TABS: 5.0000 mg | ORAL_TABLET | Freq: Every day | ORAL | 3 refills | Status: DC

## 2021-05-08 NOTE — Patient Instructions (Signed)
I value your feedback and you entrusting us with your care. If you get a [facility] patient survey, I would appreciate you taking the time to let us know about your experience today. Thank you!  [facility]: [phone]      

## 2021-05-08 NOTE — Progress Notes (Signed)
? ?PCP:  Patient, No Pcp Per (Inactive) ? ? ?Chief Complaint  ?Patient presents with  ? Gynecologic Exam  ?  No concerns  ? ? ? ?HPI: ?     Angie Weber is a 41 y.o. G2P2 whose LMP was No LMP recorded. (Menstrual status: Oral contraceptives)., presents today for her annual examination.  Her menses are absent with aygestin, no BTB, no dysmen.  ? ?Sex activity: single partner, contraception - oral progesterone-only contraceptive. No pain/bleeding.  ?Last Pap: 05/03/20  Results were: no abnormalities /neg HPV DNA ; hx of CIN 3  s/p cold knife cone bx with Dr. Georgianne Fick 12/19 ?10/05/18 Neg pap/neg HPV DNA ?12/24/2017 [facility] CIN III clear margins ?12/14/2017 Colposcopy ectocervix negative, endocervix with fragments suspicious for high grade ?09/07/17 revealed NIL HPV positive ?08/07/2016 Colposcopy negative ectocervical bx and endocervical bx ?07/02/2016 NIL HPV positive ?07/25/2015 NIL HPV positive ? ?Last mammogram: 09/21/18 Results were: normal--routine follow-up in 12 months ?There is a significant FH of breast cancer on her mat side. There is no FH of ovarian cancer. Pt is MyRisk neg 2017; IBIS=17.2%. The patient does not do self-breast exams due to fear of finding a mass.  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  ? ?Tobacco use: quit ?Alcohol use: rare ?No drug use.  ?Exercise: moderately active ? ?She does get adequate calcium and Vitamin D in her diet. ? ? ?Past Medical History:  ?Diagnosis Date  ? Anemia   ? H/O AS A CHILD  ? BRCA negative 08/17/2015  ? MyRisk  ? Family history of breast cancer   ? 2017 IBIS=17%  ? GERD (gastroesophageal reflux disease)   ? OCC-NO MEDS  ? Headache   ? MIGRAINES  ? History of kidney stones   ? Increased risk of breast cancer   ? ? ?Past Surgical History:  ?Procedure Laterality Date  ? BREAST BIOPSY Right 07-23-15  ? BENIGN BREAST TISSUE WITH INTRADUCTAL PAPILLOMA AND COLUMNAR CELL   ? BREAST BIOPSY Left 2017  ? NEG  ? BREAST  EXCISIONAL BIOPSY Right 2017  ? NEG  ? BREAST LUMPECTOMY Right 01/11/2016  ? Procedure: BREAST LUMPECTOMY;  Surgeon: Christene Lye, MD;  Location: [facility];  Service: General;  Laterality: Right;  ? CERVICAL CONIZATION W/BX N/A 02/23/2018  ? Procedure: COLD KNIFE CONIZATION CERVIX WITH BIOPSY;  Surgeon: Malachy Mood, MD;  Location: [facility];  Service: Gynecology;  Laterality: N/A;  ? TOOTH EXTRACTION  04/05/2018  ? TUBAL LIGATION    ? WISDOM TOOTH EXTRACTION    ? ? ?Family History  ?Problem Relation Age of Onset  ? Breast cancer Maternal Aunt   ?     4 mat. aunts 62's and 72's  ? Breast cancer Maternal Grandmother   ? Breast cancer Cousin   ?     5 maternal cousins  ? Breast cancer Maternal Aunt   ? Breast cancer Maternal Aunt   ? Breast cancer Maternal Aunt   ? Breast cancer Maternal Aunt   ? ? ?Social History  ? ?Socioeconomic History  ? Marital status: Married  ?  Spouse name: Not on file  ? Number of children: Not on file  ? Years of education: Not on file  ? Highest education level: Not on file  ?Occupational History  ? Not on file  ?Tobacco Use  ? Smoking status: Former  ?  Packs/day: 0.50  ?  Years: 17.00  ?  Pack years: 8.50  ?  Types: Cigarettes  ? Smokeless tobacco: Never  ?Vaping Use  ? Vaping Use: Never used  ?Substance and Sexual Activity  ? Alcohol use: No  ?  Alcohol/week: 0.0 standard drinks  ? Drug use: No  ? Sexual activity: Yes  ?  Birth control/protection: Pill  ?Other Topics Concern  ? Not on file  ?Social History Narrative  ? Not on file  ? ?Social Determinants of Health  ? ?Financial Resource Strain: Not on file  ?Food Insecurity: Not on file  ?Transportation Needs: Not on file  ?Physical Activity: Not on file  ?Stress: Not on file  ?Social Connections: Not on file  ?Intimate Partner Violence: Not on file  ? ? ? ?Current Outpatient Medications:  ?  norethindrone (AYGESTIN) 5 MG tablet, Take 1 tablet (5 mg total) by mouth daily., Disp: 90 tablet, Rfl: 3 ? ? ? ? ?ROS: ? ?Review of  Systems  ?Constitutional:  Negative for fatigue, fever and unexpected weight change.  ?Respiratory:  Negative for cough, shortness of breath and wheezing.   ?Cardiovascular:  Negative for chest pain, palpitations and leg swelling.  ?Gastrointestinal:  Negative for blood in stool, constipation, diarrhea, nausea and vomiting.  ?Endocrine: Negative for cold intolerance, heat intolerance and polyuria.  ?Genitourinary:  Negative for dyspareunia, dysuria, flank pain, frequency, genital sores, hematuria, menstrual problem, pelvic pain, urgency, vaginal bleeding, vaginal discharge and vaginal pain.  ?Musculoskeletal:  Negative for back pain, joint swelling and myalgias.  ?Skin:  Negative for rash.  ?Neurological:  Negative for dizziness, syncope, light-headedness, numbness and headaches.  ?Hematological:  Negative for adenopathy.  ?Psychiatric/Behavioral:  Negative for agitation, confusion, sleep disturbance and suicidal ideas. The patient is not nervous/anxious.   ?BREAST: No symptoms ? ? ?Objective: ?BP 122/90   Ht 5' 4.5" (1.638 m)   Wt 230 lb (104.3 kg)   BMI 38.87 kg/m?  ? ? ?Physical Exam ?Constitutional:   ?   Appearance: She is well-developed.  ?Genitourinary:  ?   Vulva normal.  ?   Right Labia: No rash, tenderness or lesions. ?   Left Labia: No tenderness, lesions or rash. ?   No vaginal discharge, erythema or tenderness.  ? ?   Right Adnexa: not tender and no mass present. ?   Left Adnexa: not tender and no mass present. ?   No cervical friability or polyp.  ?   Uterus is not enlarged or tender.  ?Breasts: ?   Right: Skin change present. No mass, nipple discharge or tenderness.  ?   Left: No mass, nipple discharge, skin change or tenderness.  ?Neck:  ?   Thyroid: No thyromegaly.  ?Cardiovascular:  ?   Rate and Rhythm: Normal rate and regular rhythm.  ?   Heart sounds: Normal heart sounds. No murmur heard. ?Pulmonary:  ?   Effort: Pulmonary effort is normal.  ?   Breath sounds: Normal breath sounds.  ?Chest:   ? ? ?Abdominal:  ?   Palpations: Abdomen is soft.  ?   Tenderness: There is no abdominal tenderness. There is no guarding or rebound.  ?Musculoskeletal:     ?   General: Normal range of motion.  ?   Cervical back: Normal range of motion.  ?Lymphadenopathy:  ?   Cervical: No cervical adenopathy.  ?Neurological:  ?   General: No focal deficit present.  ?   Mental Status: She is alert and oriented to person, place, and time.  ?   Cranial Nerves: No  cranial nerve deficit.  ?Skin: ?   General: Skin is warm and dry.  ?Psychiatric:     ?   Mood and Affect: Mood normal.     ?   Behavior: Behavior normal.     ?   Thought Content: Thought content normal.     ?   Judgment: Judgment normal.  ?Vitals reviewed.  ? ? ?Assessment/Plan: ?Encounter for annual routine gynecological examination ? ?Cervical cancer screening - Plan: Cytology - PAP ? ?Screening for HPV (human papillomavirus) - Plan: Cytology - PAP ? ?High grade squamous intraepithelial lesion (HGSIL), grade 3 CIN, on biopsy of cervix - Plan: Cytology - PAP; repeat pap today. Will f/u if abn.  ? ?Encounter for surveillance of contraceptive pills - Plan: norethindrone (AYGESTIN) 5 MG tablet; Rx RF. Doing well.  ? ?Encounter for screening mammogram for malignant neoplasm of breast - Plan: MM 3D SCREEN BREAST BILATERAL; pt to schedule mammo ? ?Family history of breast cancer - Plan: MM 3D SCREEN BREAST BILATERAL; recalculated IBIS today=17%. Recommended monthly SBE, yearly CBE and mammos. Cont Vit D supp.  ? ?Skin lesion breast--warm compresses, looks infectious vs EIC. F/u if doesn't resolve.  ? ? ?Meds ordered this encounter  ?Medications  ? norethindrone (AYGESTIN) 5 MG tablet  ?  Sig: Take 1 tablet (5 mg total) by mouth daily.  ?  Dispense:  90 tablet  ?  Refill:  3  ?  Order Specific Question:   Supervising Provider  ?  AnswerGae Dry [[id number]]  ? ?          ?GYN counsel breast self exam, mammography screening, adequate intake of calcium and vitamin D, diet  and exercise ? ? ?  F/U ? Return in about 1 year (around 05/09/2022). ? ?Maryori Weide B. Blannie Shedlock, PA-C ?05/08/2021 ?3:29 PM ?

## 2021-05-10 LAB — CYTOLOGY - PAP
Comment: NEGATIVE
Diagnosis: NEGATIVE
High risk HPV: NEGATIVE

## 2021-11-21 ENCOUNTER — Other Ambulatory Visit: Payer: Self-pay

## 2021-11-21 ENCOUNTER — Encounter
Admission: EM | Disposition: A | Discharge status: Home / Self Care | Payer: Self-pay | Source: Home / Self Care | Attending: Emergency Medicine

## 2021-11-21 ENCOUNTER — Observation Stay: Payer: Self-pay | Admitting: Anesthesiology

## 2021-11-21 ENCOUNTER — Encounter: Payer: Self-pay | Admitting: Emergency Medicine

## 2021-11-21 ENCOUNTER — Emergency Department: Payer: Self-pay

## 2021-11-21 ENCOUNTER — Observation Stay
Admission: EM | Admit: 2021-11-21 | Discharge: 2021-11-22 | Disposition: A | Discharge status: Home / Self Care | Payer: Self-pay | Attending: Surgery | Admitting: Surgery

## 2021-11-21 DIAGNOSIS — R109 Unspecified abdominal pain: Secondary | ICD-10-CM

## 2021-11-21 DIAGNOSIS — Z87891 Personal history of nicotine dependence: Secondary | ICD-10-CM | POA: Insufficient documentation

## 2021-11-21 DIAGNOSIS — K353 Acute appendicitis with localized peritonitis, without perforation or gangrene: Secondary | ICD-10-CM

## 2021-11-21 DIAGNOSIS — K3580 Unspecified acute appendicitis: Principal | ICD-10-CM | POA: Diagnosis present

## 2021-11-21 HISTORY — PX: LAPAROSCOPIC APPENDECTOMY: SHX408

## 2021-11-21 LAB — COMPREHENSIVE METABOLIC PANEL
ALT: 31 U/L (ref 0–44)
AST: 38 U/L (ref 15–41)
Albumin: 4.2 g/dL (ref 3.5–5.0)
Alkaline Phosphatase: 59 U/L (ref 38–126)
Anion gap: 9 (ref 5–15)
BUN: 6 mg/dL (ref 6–20)
CO2: 19 mmol/L — ABNORMAL LOW (ref 22–32)
Calcium: 8.8 mg/dL — ABNORMAL LOW (ref 8.9–10.3)
Chloride: 111 mmol/L (ref 98–111)
Creatinine, Ser: 0.74 mg/dL (ref 0.44–1.00)
GFR, Estimated: >60 mL/min (ref >60)
Glucose, Bld: 110 mg/dL — ABNORMAL HIGH (ref 70–99)
Potassium: 3.8 mmol/L (ref 3.5–5.1)
Sodium: 139 mmol/L (ref 135–145)
Total Bilirubin: 0.4 mg/dL (ref 0.3–1.2)
Total Protein: 7.7 g/dL (ref 6.5–8.1)

## 2021-11-21 LAB — URINALYSIS, ROUTINE W REFLEX MICROSCOPIC
Bacteria, UA: NONE SEEN
Bilirubin Urine: NEGATIVE
Glucose, UA: NEGATIVE mg/dL
Ketones, ur: NEGATIVE mg/dL
Leukocytes,Ua: NEGATIVE
Nitrite: NEGATIVE
Protein, ur: NEGATIVE mg/dL
Specific Gravity, Urine: <1.005 — ABNORMAL LOW (ref 1.005–1.030)
Squamous Epithelial / HPF: NONE SEEN (ref 0–5)
pH: 5 (ref 5.0–8.0)

## 2021-11-21 LAB — POC URINE PREG, ED: Preg Test, Ur: NEGATIVE

## 2021-11-21 LAB — CBC
HCT: 42.1 % (ref 36.0–46.0)
Hemoglobin: 14.1 g/dL (ref 12.0–15.0)
MCH: 31.5 pg (ref 26.0–34.0)
MCHC: 33.5 g/dL (ref 30.0–36.0)
MCV: 94 fL (ref 80.0–100.0)
Platelets: 232 10*3/uL (ref 150–400)
RBC: 4.48 MIL/uL (ref 3.87–5.11)
RDW: 12.8 % (ref 11.5–15.5)
WBC: 5.1 10*3/uL (ref 4.0–10.5)
nRBC: 0 % (ref 0.0–0.2)

## 2021-11-21 LAB — PREGNANCY, URINE: Preg Test, Ur: NEGATIVE

## 2021-11-21 SURGERY — APPENDECTOMY, LAPAROSCOPIC
Anesthesia: General | Site: Abdomen

## 2021-11-21 MED ORDER — FENTANYL CITRATE (PF) 100 MCG/2ML IJ SOLN
INTRAMUSCULAR | Status: DC | PRN
  Administered 2021-11-21 (×4): 50 ug via INTRAVENOUS

## 2021-11-21 MED ORDER — CHLORHEXIDINE GLUCONATE 0.12 % MT SOLN: 15.0000 mL | Freq: Once | OROMUCOSAL | Status: AC

## 2021-11-21 MED ORDER — FENTANYL CITRATE (PF) 100 MCG/2ML IJ SOLN
INTRAMUSCULAR | Status: AC
  Filled 2021-11-21: qty 2

## 2021-11-21 MED ORDER — ONDANSETRON 4 MG PO TBDP: 4.0000 mg | ORAL_TABLET | Freq: Four times a day (QID) | ORAL | Status: DC | PRN

## 2021-11-21 MED ORDER — HYDROMORPHONE HCL 1 MG/ML IJ SOLN: 0.5000 mg | Freq: Once | INTRAMUSCULAR | Status: AC

## 2021-11-21 MED ORDER — ACETAMINOPHEN 10 MG/ML IV SOLN
INTRAVENOUS | Status: AC
  Filled 2021-11-21: qty 100

## 2021-11-21 MED ORDER — PROPOFOL 10 MG/ML IV BOLUS
INTRAVENOUS | Status: AC
  Filled 2021-11-21: qty 20

## 2021-11-21 MED ORDER — OXYCODONE HCL 5 MG/5ML PO SOLN: 5.0000 mg | Freq: Once | ORAL | Status: AC | PRN

## 2021-11-21 MED ORDER — LIDOCAINE HCL (PF) 2 % IJ SOLN
INTRAMUSCULAR | Status: AC
  Filled 2021-11-21: qty 5

## 2021-11-21 MED ORDER — ONDANSETRON HCL 4 MG/2ML IJ SOLN: 4.0000 mg | Freq: Four times a day (QID) | INTRAMUSCULAR | Status: DC | PRN

## 2021-11-21 MED ORDER — HYDROMORPHONE HCL 1 MG/ML IJ SOLN: 0.5000 mg | INTRAMUSCULAR | Status: DC | PRN

## 2021-11-21 MED ORDER — CIPROFLOXACIN IN D5W 400 MG/200ML IV SOLN
400.0000 mg | Freq: Two times a day (BID) | INTRAVENOUS | Status: DC
  Administered 2021-11-21 – 2021-11-22 (×2): 400 mg via INTRAVENOUS
  Filled 2021-11-21 (×2): qty 200

## 2021-11-21 MED ORDER — MIDAZOLAM HCL 2 MG/2ML IJ SOLN
INTRAMUSCULAR | Status: AC
  Filled 2021-11-21: qty 2

## 2021-11-21 MED ORDER — MIDAZOLAM HCL 2 MG/2ML IJ SOLN
INTRAMUSCULAR | Status: DC | PRN
  Administered 2021-11-21: 2 mg via INTRAVENOUS

## 2021-11-21 MED ORDER — DEXMEDETOMIDINE HCL IN NACL 80 MCG/20ML IV SOLN
INTRAVENOUS | Status: DC | PRN
  Administered 2021-11-21 (×3): 4 ug via BUCCAL

## 2021-11-21 MED ORDER — ONDANSETRON HCL 4 MG/2ML IJ SOLN
INTRAMUSCULAR | Status: DC | PRN
  Administered 2021-11-21: 4 mg via INTRAVENOUS

## 2021-11-21 MED ORDER — SUCCINYLCHOLINE CHLORIDE 200 MG/10ML IV SOSY
PREFILLED_SYRINGE | INTRAVENOUS | Status: DC | PRN
  Administered 2021-11-21: 100 mg via INTRAVENOUS

## 2021-11-21 MED ORDER — ONDANSETRON 4 MG PO TBDP
8.0000 mg | ORAL_TABLET | Freq: Once | ORAL | Status: AC
  Administered 2021-11-21: 8 mg via ORAL
  Filled 2021-11-21: qty 2

## 2021-11-21 MED ORDER — LACTATED RINGERS IV SOLN: INTRAVENOUS | Status: DC

## 2021-11-21 MED ORDER — FENTANYL CITRATE (PF) 100 MCG/2ML IJ SOLN: 25.0000 ug | INTRAMUSCULAR | Status: DC | PRN

## 2021-11-21 MED ORDER — OXYCODONE HCL 5 MG PO TABS
5.0000 mg | ORAL_TABLET | Freq: Once | ORAL | Status: AC | PRN
  Administered 2021-11-21: 5 mg

## 2021-11-21 MED ORDER — 0.9 % SODIUM CHLORIDE (POUR BTL) OPTIME
TOPICAL | Status: DC | PRN
  Administered 2021-11-21: 10 mL

## 2021-11-21 MED ORDER — KETOROLAC TROMETHAMINE 30 MG/ML IJ SOLN
INTRAMUSCULAR | Status: DC | PRN
  Administered 2021-11-21: 30 mg via INTRAVENOUS

## 2021-11-21 MED ORDER — ORAL CARE MOUTH RINSE: 15.0000 mL | Freq: Once | OROMUCOSAL | Status: AC

## 2021-11-21 MED ORDER — OXYCODONE HCL 5 MG PO TABS
ORAL_TABLET | ORAL | Status: AC
  Filled 2021-11-21: qty 1

## 2021-11-21 MED ORDER — BUPIVACAINE-EPINEPHRINE (PF) 0.5% -1:200000 IJ SOLN
INTRAMUSCULAR | Status: DC | PRN
  Administered 2021-11-21: 30 mL

## 2021-11-21 MED ORDER — DEXAMETHASONE SODIUM PHOSPHATE 10 MG/ML IJ SOLN
INTRAMUSCULAR | Status: DC | PRN
  Administered 2021-11-21: 10 mg via INTRAVENOUS

## 2021-11-21 MED ORDER — SUGAMMADEX SODIUM 500 MG/5ML IV SOLN
INTRAVENOUS | Status: AC
  Filled 2021-11-21: qty 5

## 2021-11-21 MED ORDER — DROPERIDOL 2.5 MG/ML IJ SOLN: 0.6250 mg | Freq: Once | INTRAMUSCULAR | Status: DC | PRN

## 2021-11-21 MED ORDER — METOCLOPRAMIDE HCL 5 MG/ML IJ SOLN
INTRAMUSCULAR | Status: AC
  Filled 2021-11-21: qty 2

## 2021-11-21 MED ORDER — BUPIVACAINE-EPINEPHRINE (PF) 0.5% -1:200000 IJ SOLN
INTRAMUSCULAR | Status: AC
  Filled 2021-11-21: qty 30

## 2021-11-21 MED ORDER — PANTOPRAZOLE SODIUM 40 MG IV SOLR: 40.0000 mg | Freq: Every day | INTRAVENOUS | Status: DC

## 2021-11-21 MED ORDER — ACETAMINOPHEN 500 MG PO TABS: 1000.0000 mg | ORAL_TABLET | Freq: Four times a day (QID) | ORAL | Status: DC | PRN

## 2021-11-21 MED ORDER — MORPHINE SULFATE (PF) 4 MG/ML IV SOLN
4.0000 mg | Freq: Once | INTRAVENOUS | Status: AC
  Administered 2021-11-21: 4 mg via INTRAVENOUS
  Filled 2021-11-21: qty 1

## 2021-11-21 MED ORDER — ACETAMINOPHEN 10 MG/ML IV SOLN
INTRAVENOUS | Status: DC | PRN
  Administered 2021-11-21: 1000 mg via INTRAVENOUS

## 2021-11-21 MED ORDER — OXYCODONE HCL 5 MG PO TABS
5.0000 mg | ORAL_TABLET | ORAL | Status: DC | PRN
  Administered 2021-11-22 (×2): 5 mg via ORAL
  Filled 2021-11-21 (×2): qty 1

## 2021-11-21 MED ORDER — HYDROMORPHONE HCL 1 MG/ML IJ SOLN
INTRAMUSCULAR | Status: AC
  Administered 2021-11-21: 0.5 mg via INTRAVENOUS
  Filled 2021-11-21: qty 0.5

## 2021-11-21 MED ORDER — PROMETHAZINE HCL 25 MG/ML IJ SOLN: 6.2500 mg | INTRAMUSCULAR | Status: DC | PRN

## 2021-11-21 MED ORDER — SUGAMMADEX SODIUM 200 MG/2ML IV SOLN
INTRAVENOUS | Status: DC | PRN
  Administered 2021-11-21: 200 mg via INTRAVENOUS

## 2021-11-21 MED ORDER — ROCURONIUM BROMIDE 100 MG/10ML IV SOLN
INTRAVENOUS | Status: DC | PRN
  Administered 2021-11-21: 30 mg via INTRAVENOUS

## 2021-11-21 MED ORDER — KETOROLAC TROMETHAMINE 30 MG/ML IJ SOLN
INTRAMUSCULAR | Status: AC
  Filled 2021-11-21: qty 1

## 2021-11-21 MED ORDER — PROPOFOL 10 MG/ML IV BOLUS
INTRAVENOUS | Status: DC | PRN
  Administered 2021-11-21: 200 mg via INTRAVENOUS

## 2021-11-21 MED ORDER — GLYCOPYRROLATE 0.2 MG/ML IJ SOLN
INTRAMUSCULAR | Status: DC | PRN
  Administered 2021-11-21: .2 mg via INTRAVENOUS

## 2021-11-21 MED ORDER — LIDOCAINE HCL (CARDIAC) PF 100 MG/5ML IV SOSY
PREFILLED_SYRINGE | INTRAVENOUS | Status: DC | PRN
  Administered 2021-11-21: 100 mg via INTRAVENOUS

## 2021-11-21 MED ORDER — SODIUM CHLORIDE 0.9 % IV BOLUS
1000.0000 mL | Freq: Once | INTRAVENOUS | Status: AC
  Administered 2021-11-21: 1000 mL via INTRAVENOUS

## 2021-11-21 MED ORDER — KETOROLAC TROMETHAMINE 30 MG/ML IJ SOLN
30.0000 mg | Freq: Four times a day (QID) | INTRAMUSCULAR | Status: DC
  Administered 2021-11-22 (×2): 30 mg via INTRAVENOUS
  Filled 2021-11-21 (×2): qty 1

## 2021-11-21 MED ORDER — POLYETHYLENE GLYCOL 3350 17 G PO PACK: 17.0000 g | PACK | Freq: Every day | ORAL | Status: DC | PRN

## 2021-11-21 MED ORDER — CHLORHEXIDINE GLUCONATE 0.12 % MT SOLN
OROMUCOSAL | Status: AC
  Administered 2021-11-21: 15 mL via OROMUCOSAL
  Filled 2021-11-21: qty 15

## 2021-11-21 MED ORDER — ACETAMINOPHEN 10 MG/ML IV SOLN: 1000.0000 mg | Freq: Once | INTRAVENOUS | Status: DC | PRN

## 2021-11-21 MED ORDER — METRONIDAZOLE 500 MG/100ML IV SOLN
500.0000 mg | Freq: Two times a day (BID) | INTRAVENOUS | Status: DC
  Administered 2021-11-21 – 2021-11-22 (×2): 500 mg via INTRAVENOUS
  Filled 2021-11-21 (×3): qty 100

## 2021-11-21 MED ORDER — KETOROLAC TROMETHAMINE 15 MG/ML IJ SOLN
15.0000 mg | Freq: Once | INTRAMUSCULAR | Status: AC
  Administered 2021-11-21: 15 mg via INTRAMUSCULAR
  Filled 2021-11-21: qty 1

## 2021-11-21 MED ORDER — METOCLOPRAMIDE HCL 5 MG/ML IJ SOLN
10.0000 mg | Freq: Once | INTRAMUSCULAR | Status: AC
  Administered 2021-11-21: 10 mg via INTRAVENOUS

## 2021-11-21 SURGICAL SUPPLY — 45 items
CUTTER FLEX LINEAR 45M (STAPLE) IMPLANT
DERMABOND ADVANCED .7 DNX12 (GAUZE/BANDAGES/DRESSINGS) ×1 IMPLANT
ELECT CAUTERY BLADE TIP 2.5 (TIP) ×1
ELECT REM PT RETURN 9FT ADLT (ELECTROSURGICAL) ×1
ELECTRODE CAUTERY BLDE TIP 2.5 (TIP) ×1 IMPLANT
ELECTRODE REM PT RTRN 9FT ADLT (ELECTROSURGICAL) ×1 IMPLANT
GLOVE SURG SYN 7.0 (GLOVE) ×3 IMPLANT
GLOVE SURG SYN 7.0 PF PI (GLOVE) ×1 IMPLANT
GLOVE SURG SYN 7.5  E (GLOVE) ×1
GLOVE SURG SYN 7.5 E (GLOVE) ×1 IMPLANT
GLOVE SURG SYN 7.5 PF PI (GLOVE) ×1 IMPLANT
GOWN STRL REUS W/ TWL LRG LVL3 (GOWN DISPOSABLE) ×2 IMPLANT
GOWN STRL REUS W/TWL LRG LVL3 (GOWN DISPOSABLE) ×2
IRRIGATION STRYKERFLOW (MISCELLANEOUS) IMPLANT
IRRIGATOR STRYKERFLOW (MISCELLANEOUS)
IV NS 1000ML (IV SOLUTION)
IV NS 1000ML BAXH (IV SOLUTION) ×1 IMPLANT
KIT TURNOVER KIT A (KITS) ×1 IMPLANT
LABEL OR SOLS (LABEL) ×1 IMPLANT
LIGASURE LAP MARYLAND 5MM 37CM (ELECTROSURGICAL) IMPLANT
MANIFOLD NEPTUNE II (INSTRUMENTS) ×1 IMPLANT
NEEDLE HYPO 22GX1.5 SAFETY (NEEDLE) ×1 IMPLANT
NS IRRIG 500ML POUR BTL (IV SOLUTION) ×1 IMPLANT
PACK LAP CHOLECYSTECTOMY (MISCELLANEOUS) ×1 IMPLANT
RELOAD 45 VASCULAR/THIN (ENDOMECHANICALS) IMPLANT
RELOAD STAPLE 45 2.5 WHT GRN (ENDOMECHANICALS) IMPLANT
RELOAD STAPLE 45 3.5 BLU ETS (ENDOMECHANICALS) ×1 IMPLANT
RELOAD STAPLE TA45 3.5 REG BLU (ENDOMECHANICALS) ×2 IMPLANT
SCISSORS METZENBAUM CVD 33 (INSTRUMENTS) ×1 IMPLANT
SLEEVE ADV FIXATION 5X100MM (TROCAR) ×1 IMPLANT
SUT MNCRL 4-0 (SUTURE) ×1
SUT MNCRL 4-0 27XMFL (SUTURE) ×1
SUT VIC AB 3-0 SH 27 (SUTURE) ×1
SUT VIC AB 3-0 SH 27X BRD (SUTURE) IMPLANT
SUT VICRYL 0 AB UR-6 (SUTURE) ×1 IMPLANT
SUTURE MNCRL 4-0 27XMF (SUTURE) ×1 IMPLANT
SYS BAG RETRIEVAL 10MM (BASKET) ×1
SYS KII FIOS ACCESS ABD 5X100 (TROCAR) ×1
SYSTEM BAG RETRIEVAL 10MM (BASKET) ×1 IMPLANT
SYSTEM KII FIOS ACES ABD 5X100 (TROCAR) ×1 IMPLANT
TRAP FLUID SMOKE EVACUATOR (MISCELLANEOUS) ×1 IMPLANT
TRAY FOLEY MTR SLVR 16FR STAT (SET/KITS/TRAYS/PACK) ×1 IMPLANT
TROCAR BALLN GELPORT 12X130M (ENDOMECHANICALS) ×1 IMPLANT
TUBING EVAC SMOKE HEATED PNEUM (TUBING) ×1 IMPLANT
WATER STERILE IRR 500ML POUR (IV SOLUTION) ×1 IMPLANT

## 2021-11-21 NOTE — ED Notes (Signed)
Called lab. Hard stick

## 2021-11-21 NOTE — Anesthesia Preprocedure Evaluation (Addendum)
Anesthesia Evaluation  Patient identified by MRN, date of birth, ID band Patient awake    Reviewed: Allergy & Precautions, NPO status , Patient's Chart, lab work & pertinent test results  Airway Mallampati: III  TM Distance: >3 FB Neck ROM: full    Dental  (+) Edentulous Upper, Edentulous Lower   Pulmonary neg pulmonary ROS, Patient abstained from smoking., former smoker,    Pulmonary exam normal        Cardiovascular negative cardio ROS Normal cardiovascular exam     Neuro/Psych negative neurological ROS  negative psych ROS   GI/Hepatic Neg liver ROS, GERD  ,Acute appendicitis   Endo/Other  negative endocrine ROS  Renal/GU      Musculoskeletal   Abdominal (+) + obese,   Peds  Hematology negative hematology ROS (+)   Anesthesia Other Findings Past Medical History: No date: Anemia     Comment:  H/O AS A CHILD 08/17/2015: BRCA negative     Comment:  MyRisk No date: Family history of breast cancer     Comment:  2017 IBIS=17% No date: GERD (gastroesophageal reflux disease)     Comment:  OCC-NO MEDS No date: Headache     Comment:  MIGRAINES No date: History of kidney stones No date: Increased risk of breast cancer  Past Surgical History: 07-23-15: BREAST BIOPSY; Right     Comment:  BENIGN BREAST TISSUE WITH INTRADUCTAL PAPILLOMA AND               COLUMNAR CELL  2017: BREAST BIOPSY; Left     Comment:  NEG 2017: BREAST EXCISIONAL BIOPSY; Right     Comment:  NEG 01/11/2016: BREAST LUMPECTOMY; Right     Comment:  Procedure: BREAST LUMPECTOMY;  Surgeon: Christene Lye, MD;  Location: [facility];  Service: General;                Laterality: Right; 02/23/2018: CERVICAL CONIZATION W/BX; N/A     Comment:  Procedure: COLD KNIFE CONIZATION CERVIX WITH BIOPSY;                Surgeon: Malachy Mood, MD;  Location: [facility];                Service: Gynecology;  Laterality: N/A; 04/05/2018:  TOOTH EXTRACTION No date: TUBAL LIGATION No date: WISDOM TOOTH EXTRACTION  BMI    Body Mass Index: 36.33 kg/m      Reproductive/Obstetrics negative OB ROS                           Anesthesia Physical Anesthesia Plan  ASA: 2  Anesthesia Plan: General ETT   Post-op Pain Management: Toradol IV (intra-op)* and Ofirmev IV (intra-op)*   Induction: Intravenous and Rapid sequence  PONV Risk Score and Plan: 4 or greater and Ondansetron, Dexamethasone, Midazolam and Metaclopromide  Airway Management Planned: Oral ETT  Additional Equipment:   Intra-op Plan:   Post-operative Plan: Extubation in OR  Informed Consent: I have reviewed the patients History and Physical, chart, labs and discussed the procedure including the risks, benefits and alternatives for the proposed anesthesia with the patient or authorized representative who has indicated his/her understanding and acceptance.     Dental Advisory Given  Plan Discussed with: Anesthesiologist, CRNA and Surgeon  Anesthesia Plan Comments:        Anesthesia Quick Evaluation

## 2021-11-21 NOTE — Anesthesia Procedure Notes (Signed)
Procedure Name: Intubation Date/Time: 11/21/2021 7:58 PM  Performed by: Jerrye Noble, CRNAPre-anesthesia Checklist: Patient identified, Emergency Drugs available, Suction available and Patient being monitored Patient Re-evaluated:Patient Re-evaluated prior to induction Oxygen Delivery Method: Circle system utilized Preoxygenation: Pre-oxygenation with 100% oxygen Induction Type: IV induction and Rapid sequence Laryngoscope Size: McGraph and 3 Grade View: Grade I Tube type: Oral Tube size: 7.0 mm Number of attempts: 1 Airway Equipment and Method: Stylet and Video-laryngoscopy Placement Confirmation: ETT inserted through vocal cords under direct vision, positive ETCO2 and breath sounds checked- equal and bilateral Secured at: 22 cm Tube secured with: Tape Dental Injury: Teeth and Oropharynx as per pre-operative assessment

## 2021-11-21 NOTE — H&P (Signed)
Date of Admission:  11/21/2021  Reason for Admission: Acute appendicitis  History of Present Illness: Angie Weber is a 41 y.o. female presenting for evaluation in the emergency room due to worsening abdominal pain in the right side.  The patient reports that she started having some soreness in the right side of the abdomen yesterday afternoon and then last night started continuing to become severe.  The pain, more particularly, is located in the right lower quadrant but is radiating towards the right flank.  Today the patient continues to worsen she presented to emergency room for further evaluation.  She has had nausea but no emesis.  Denies any fevers, chills, chest pain, shortness of breath.  Unfortunately she also had a migraine headache for the last 4 days.  She reports a history of right-sided kidney stones when she was pregnant and initially thought based on the pain location that this could be going on as well.  In the emergency room, laboratory work-up overall was unremarkable with a normal white blood cell count of 5.1, normal LFTs, and a urinalysis that was clean.  CT scan of the abdomen and pelvis, however, does show an inflamed appendix measuring about 8 mm with some minimal surrounding fat stranding.  Past Medical History: Past Medical History:  Diagnosis Date   Anemia    H/O AS A CHILD   BRCA negative 08/17/2015   MyRisk   Family history of breast cancer    2017 IBIS=17%   GERD (gastroesophageal reflux disease)    OCC-NO MEDS   Headache    MIGRAINES   History of kidney stones    Increased risk of breast cancer      Past Surgical History: Past Surgical History:  Procedure Laterality Date   BREAST BIOPSY Right 07-23-15   BENIGN BREAST TISSUE WITH INTRADUCTAL PAPILLOMA AND COLUMNAR CELL    BREAST BIOPSY Left 2017   NEG   BREAST EXCISIONAL BIOPSY Right 2017   NEG   BREAST LUMPECTOMY Right 01/11/2016   Procedure: BREAST LUMPECTOMY;  Surgeon: Christene Lye, MD;   Location: [facility];  Service: General;  Laterality: Right;   CERVICAL CONIZATION W/BX N/A 02/23/2018   Procedure: COLD KNIFE CONIZATION CERVIX WITH BIOPSY;  Surgeon: Malachy Mood, MD;  Location: [facility];  Service: Gynecology;  Laterality: N/A;   TOOTH EXTRACTION  04/05/2018   TUBAL LIGATION     WISDOM TOOTH EXTRACTION      Home Medications: Prior to Admission medications   Medication Sig Start Date End Date Taking? Authorizing Provider  norethindrone (AYGESTIN) 5 MG tablet Take 1 tablet (5 mg total) by mouth daily. 09/07/65  Yes Copland, Deirdre Evener, PA-C    Allergies: Allergies  Allergen Reactions   Penicillins Anaphylaxis and Other (See Comments)    Has patient had a PCN reaction causing immediate rash, facial/tongue/throat swelling, SOB or lightheadedness with hypotension: Unknown Has patient had a PCN reaction causing severe rash involving mucus membranes or skin necrosis: Unknown Has patient had a PCN reaction that required hospitalization: Unknown Has patient had a PCN reaction occurring within the last 10 years: No If all of the above answers are "NO", then may proceed with Cephalosporin use.    Sulfa Antibiotics Hives    SORES IN MOUTH AND THROAT    Social History:  reports that she has quit smoking. Her smoking use included cigarettes. She has a 8.50 pack-year smoking history. She has never used smokeless tobacco. She reports that she does not drink alcohol and does  not use drugs.   Family History: Family History  Problem Relation Age of Onset   Breast cancer Maternal Aunt        4 mat. aunts 40's and 29's   Breast cancer Maternal Grandmother    Breast cancer Cousin        5 maternal cousins   Breast cancer Maternal Aunt    Breast cancer Maternal Aunt    Breast cancer Maternal Aunt    Breast cancer Maternal Aunt     Review of Systems: Review of Systems  Constitutional:  Negative for chills and fever.  HENT:  Negative for hearing loss.   Respiratory:   Negative for shortness of breath.   Cardiovascular:  Negative for chest pain.  Gastrointestinal:  Positive for abdominal pain and nausea. Negative for constipation, diarrhea and vomiting.  Genitourinary:  Negative for dysuria.  Musculoskeletal:  Positive for back pain. Negative for myalgias.  Skin:  Negative for rash.  Neurological:  Positive for headaches.  Psychiatric/Behavioral:  Negative for depression.     Physical Exam BP 116/70   Pulse 70   Temp 98.5 F (36.9 C) (Oral)   Resp 17   Ht 5' 4.5" (1.638 m)   Wt 97.5 kg   SpO2 95%   BMI 36.33 kg/m  CONSTITUTIONAL: No acute distress, well-nourished HEENT:  Normocephalic, atraumatic, extraocular motion intact. NECK: Trachea is midline, and there is no jugular venous distension.  RESPIRATORY:  Normal respiratory effort without pathologic use of accessory muscles. CARDIOVASCULAR: Regular rhythm and rate. GI: The abdomen is soft, nondistended, with tenderness to palpation in the right lower quadrant.  No diffuse peritonitis.  MUSCULOSKELETAL:  Normal muscle strength and tone in all four extremities.  No peripheral edema or cyanosis. SKIN: Skin turgor is normal. There are no pathologic skin lesions.  NEUROLOGIC:  Motor and sensation is grossly normal.  Cranial nerves are grossly intact. PSYCH:  Alert and oriented to person, place and time. Affect is normal.  Laboratory Analysis: Results for orders placed or performed during the hospital encounter of 11/21/21 (from the past 24 hour(s))  Urinalysis, Routine w reflex microscopic     Status: Abnormal   Collection Time: 11/21/21  9:57 AM  Result Value Ref Range   Color, Urine YELLOW (A) YELLOW   APPearance CLEAR CLEAR   Specific Gravity, Urine <1.005 (L) 1.005 - 1.030   pH 5.0 5.0 - 8.0   Glucose, UA NEGATIVE NEGATIVE mg/dL   Hgb urine dipstick MODERATE (A) NEGATIVE   Bilirubin Urine NEGATIVE NEGATIVE   Ketones, ur NEGATIVE NEGATIVE mg/dL   Protein, ur NEGATIVE NEGATIVE mg/dL    Nitrite NEGATIVE NEGATIVE   Leukocytes,Ua NEGATIVE NEGATIVE   RBC / HPF 0-5 0 - 5 RBC/hpf   WBC, UA 0-5 0 - 5 WBC/hpf   Bacteria, UA NONE SEEN NONE SEEN   Squamous Epithelial / LPF NONE SEEN 0 - 5  Pregnancy, urine     Status: None   Collection Time: 11/21/21  9:57 AM  Result Value Ref Range   Preg Test, Ur NEGATIVE NEGATIVE  Comprehensive metabolic panel     Status: Abnormal   Collection Time: 11/21/21 10:06 AM  Result Value Ref Range   Sodium 139 135 - 145 mmol/L   Potassium 3.8 3.5 - 5.1 mmol/L   Chloride 111 98 - 111 mmol/L   CO2 19 (L) 22 - 32 mmol/L   Glucose, Bld 110 (H) 70 - 99 mg/dL   BUN 6 6 - 20 mg/dL   Creatinine,  Ser 0.74 0.44 - 1.00 mg/dL   Calcium 8.8 (L) 8.9 - 10.3 mg/dL   Total Protein 7.7 6.5 - 8.1 g/dL   Albumin 4.2 3.5 - 5.0 g/dL   AST 38 15 - 41 U/L   ALT 31 0 - 44 U/L   Alkaline Phosphatase 59 38 - 126 U/L   Total Bilirubin 0.4 0.3 - 1.2 mg/dL   GFR, Estimated >60 >60 mL/min   Anion gap 9 5 - 15  CBC     Status: None   Collection Time: 11/21/21 10:06 AM  Result Value Ref Range   WBC 5.1 4.0 - 10.5 K/uL   RBC 4.48 3.87 - 5.11 MIL/uL   Hemoglobin 14.1 12.0 - 15.0 g/dL   HCT 42.1 36.0 - 46.0 %   MCV 94.0 80.0 - 100.0 fL   MCH 31.5 26.0 - 34.0 pg   MCHC 33.5 30.0 - 36.0 g/dL   RDW 12.8 11.5 - 15.5 %   Platelets 232 150 - 400 K/uL   nRBC 0.0 0.0 - 0.2 %    Imaging: CT Renal Stone Study  Result Date: 11/21/2021 CLINICAL DATA:  Flank pain.  Kidney stone suspected. EXAM: CT ABDOMEN AND PELVIS WITHOUT CONTRAST TECHNIQUE: Multidetector CT imaging of the abdomen and pelvis was performed following the standard protocol without IV contrast. RADIATION DOSE REDUCTION: This exam was performed according to the departmental dose-optimization program which includes automated exposure control, adjustment of the mA and/or kV according to patient size and/or use of iterative reconstruction technique. COMPARISON:  None Available. FINDINGS: Lower chest: No acute  abnormality. Hepatobiliary: No focal liver abnormality is seen. No gallstones, gallbladder wall thickening, or biliary dilatation. Pancreas: Unremarkable. No pancreatic ductal dilatation or surrounding inflammatory changes. Spleen: Normal in size without focal abnormality. Adrenals/Urinary Tract: Adrenal glands are unremarkable. Kidneys are normal, without renal calculi, focal lesion, or hydronephrosis. Bladder is unremarkable. Stomach/Bowel: Stomach is within normal limits. The appendix is at the upper limits of normal in size measuring up to 8 mm with minimal surrounding fat stranding. No evidence of bowel wall thickening, distention, or inflammatory changes. Vascular/Lymphatic: No significant vascular findings are present. No enlarged abdominal or pelvic lymph nodes. Reproductive: Uterus and bilateral adnexa are unremarkable. Other: No abdominal wall hernia or abnormality. No abdominopelvic ascites. Musculoskeletal: No acute or significant osseous findings. IMPRESSION: 1.  No evidence of hydronephrosis or nephrolithiasis. 2.The appendix is at the upper limits of normal in size with mild surrounding inflammatory fat stranding. This is a nonspecific finding and incompletely assessed in the absence of IV contrast. Correlate for symptoms of right lower quadrant pain. Electronically Signed   By: Marin Roberts M.D.   On: 11/21/2021 12:50    Assessment and Plan: This is a 41 y.o. female with acute appendicitis.  - Discussed with patient the findings on the CT scan and that her appendix is appearing inflamed and mildly dilated at 8 mm.  Her appendix is retrocecal which is why the pain is radiating towards her right flank which could be confused by kidney stones.  Discussed with her the potential treatment options including surgical intervention with a laparoscopic appendectomy versus antibiotic management and discussed the benefits and cons of each.  After discussion, the patient is opted to proceed with surgery. -  Discussed with the patient and the plan for a laparoscopic appendectomy.  Reviewed with her the surgery at length including the incisions, the risk of bleeding, infection, injury to surrounding structures, hospital stay, potential for drain, postoperative pain control,  activity restrictions, and she is willing to proceed.  All of her questions have been answered. - Patient will be admitted to the surgical team and be taken to the operating room today pending anesthesia/OR team availability.  I spent 75 minutes dedicated to the care of this patient on the date of this encounter to include pre-visit review of records, face-to-face time with the patient discussing diagnosis and management, and any post-visit coordination of care.   Melvyn Neth, MD [facility] Pg:  [phone]

## 2021-11-21 NOTE — Op Note (Signed)
  Procedure Date:  11/21/2021  Pre-operative Diagnosis:  Acute appendicitis  Post-operative Diagnosis: Acute appendicitis  Procedure:  Laparoscopic appendectomy  Surgeon:  Melvyn Neth, MD  Anesthesia:  General endotracheal  Estimated Blood Loss:  5 ml  Specimens:  appendix  Complications:  None  Indications for Procedure:  This is a 41 y.o. female who presents with abdominal pain and workup revealing acute appendicitis.  The options of surgery versus observation were reviewed with the patient and/or family. The risks of bleeding, infection, recurrence of symptoms, negative laparoscopy, potential for an open procedure, bowel injury, abscess or infection, were all discussed with the patient and she was willing to proceed.  Description of Procedure: The patient was correctly identified in the preoperative area and brought into the operating room.  The patient was placed supine with VTE prophylaxis in place.  Appropriate time-outs were performed.  Anesthesia was induced and the patient was intubated.  Foley catheter was placed.  Appropriate antibiotics were infused.  The abdomen was prepped and draped in a sterile fashion. An infraumbilical incision was made. A cutdown technique was used to enter the abdominal cavity without injury, and a Hasson trocar was inserted.  Pneumoperitoneum was obtained with appropriate opening pressures.  Two 5-mm ports were placed in the suprapubic and left lateral positions under direct visualization.  The right lower quadrant was inspected and the appendix was identified and found to be acutely inflamed and in retrocecal position.  The appendix was carefully dissected.  The mesoappendix was divided using the LigaSure.  The base of the appendix was dissected out and divided with a standard load Endo GIA.  The appendix was placed in an Endocatch bag.  The right lower quadrant was then inspected again revealing an intact staple line, no bleeding, and no bowel  injury.  The area was thoroughly irrigated.  The 5 mm ports were removed under direct visualization and the Hasson trocar was removed.  The Endocatch bag was brought out through the umbilical incision.  The fascial opening was closed using 0 vicryl suture.  Local anesthetic was infused in all incisions.  The umbilical incision was closed with 3-0 Vicryl and 4-0 Monocryl, and the remaining port incisions were closed with 4-0 Monocryl.  The wounds were cleaned and sealed with DermaBond.  Foley catheter was removed and the patient was emerged from anesthesia and extubated and brought to the recovery room for further management.  The patient tolerated the procedure well and all counts were correct at the end of the case.   Melvyn Neth, MD

## 2021-11-21 NOTE — ED Provider Notes (Signed)
[facility] Provider Note    Event Date/Time   First MD Initiated Contact with Patient 11/21/21 1046     (approximate)   History   Chief Complaint: Back Pain and Migraine   HPI  Angie Weber is a 41 y.o. female with a history of migraines, GERD who comes ED complaining of right flank pain that started yesterday associated with nausea.  Pain is aching, severe, constant, no aggravating or alleviating factors.  No vomiting diarrhea constipation or fever.  No trauma.  No dysuria.  Patient also notes that she has had a migraine for the last 3 days which is a typical syndrome for her.  Its not particularly severe or in any other way uncharacteristic of her usual migraine pattern.     Physical Exam   Triage Vital Signs: ED Triage Vitals  Enc Vitals Group     BP 11/21/21 0936 (!) 164/114     Pulse Rate 11/21/21 0936 89     Resp 11/21/21 0936 18     Temp 11/21/21 0936 98.2 F (36.8 C)     Temp Source 11/21/21 0936 Oral     SpO2 11/21/21 0936 100 %     Weight 11/21/21 0938 215 lb (97.5 kg)     Height 11/21/21 0938 5' 4.5" (1.638 m)     Head Circumference --      Peak Flow --      Pain Score 11/21/21 0937 10     Pain Loc --      Pain Edu? --      Excl. in [place]? --     Most recent vital signs: Vitals:   11/21/21 1330 11/21/21 1359  BP: 116/70   Pulse: 70   Resp: 17   Temp:  98.5 F (36.9 C)  SpO2: 95%     General: Awake, no distress.  CV:  Good peripheral perfusion.  Regular rate and rhythm Resp:  Normal effort.  Clear to auscultation bilaterally Abd:  No distention.  Soft nontender.  Right-sided CVA tenderness. Other:  No lower extremity edema, no rash.  Moist oral mucosa.   ED Results / Procedures / Treatments   Labs (all labs ordered are listed, but only abnormal results are displayed) Labs Reviewed  COMPREHENSIVE METABOLIC PANEL - Abnormal; Notable for the following components:      Result Value   CO2 19 (*)    Glucose, Bld 110 (*)     Calcium 8.8 (*)    All other components within normal limits  URINALYSIS, ROUTINE W REFLEX MICROSCOPIC - Abnormal; Notable for the following components:   Color, Urine YELLOW (*)    Specific Gravity, Urine <1.005 (*)    Hgb urine dipstick MODERATE (*)    All other components within normal limits  CBC  PREGNANCY, URINE  POC URINE PREG, ED     EKG Interpreted by me Sinus rhythm rate of 72.  Normal axis, normal intervals.  Normal QRS ST segments and T waves.  No ischemic changes.   RADIOLOGY Interpreted by me No hydronephrosis or ureterolithiasis.  No bowel obstruction.  Radiology report reviewed suggesting possible early appendicitis.   PROCEDURES:  Procedures   MEDICATIONS ORDERED IN ED: Medications  ketorolac (TORADOL) 15 MG/ML injection 15 mg (15 mg Intramuscular Given 11/21/21 1115)  ondansetron (ZOFRAN-ODT) disintegrating tablet 8 mg (8 mg Oral Given 11/21/21 1116)  sodium chloride 0.9 % bolus 1,000 mL (1,000 mLs Intravenous New Bag/Given 11/21/21 1359)  morphine (PF) 4 MG/ML injection  4 mg (4 mg Intravenous Given 11/21/21 1359)     IMPRESSION / MDM / ASSESSMENT AND PLAN / ED COURSE  I reviewed the triage vital signs and the nursing notes.                              Differential diagnosis includes, but is not limited to, kidney stone, UTI, musculoskeletal pain, complicated migraine, dehydration, pregnancy  Patient's presentation is most consistent with acute presentation with potential threat to life or bodily function.  Patient presents with flank pain, unremarkable vital signs.  Labs including urinalysis and serum labs are all normal.  Will obtain CT without contrast.  Patient given Toradol and Zofran for symptom relief.   Clinical Course as of 11/21/21 1403  Thu Nov 21, 2021  1344 CT suggests possible early appendicitis. Still with pronounced RLQ tenderness. Will consult surgery to eval.   [PS]    Clinical Course User Index [PS] Carrie Mew, MD     ----------------------------------------- 2:02 PM on 11/21/2021 ----------------------------------------- Will give IV fluids, IV morphine for hydration and pain relief while awaiting surgery recommendations.  If surgical evaluation is clinically doubtful for appendicitis, may benefit from CT abdomen pelvis with IV contrast to further evaluate.   FINAL CLINICAL IMPRESSION(S) / ED DIAGNOSES   Final diagnoses:  Flank pain     Rx / DC Orders   ED Discharge Orders     None        Note:  This document was prepared using Dragon voice recognition software and may include unintentional dictation errors.   Carrie Mew, MD 11/21/21 [phone]

## 2021-11-21 NOTE — ED Triage Notes (Signed)
Pt to ER states a migraine that started on Monday and has been getting worse.  Pt states starting last night she noted bilateral "kidney" pain and nausea.  Pt denies other urinary symptoms.  Pt reports remote hx of kidney stones.  Noted elevated BP in triage, denies hx of same.

## 2021-11-21 NOTE — Transfer of Care (Signed)
Immediate Anesthesia Transfer of Care Note  Patient: Angie Weber  Procedure(s) Performed: APPENDECTOMY LAPAROSCOPIC (Abdomen)  Patient Location: PACU  Anesthesia Type:General  Level of Consciousness: awake, drowsy and patient cooperative  Airway & Oxygen Therapy: Patient Spontanous Breathing and Patient connected to face mask oxygen  Post-op Assessment: Report given to RN and Post -op Vital signs reviewed and stable  Post vital signs: Reviewed and stable  Last Vitals:  Vitals Value Taken Time  BP 148/91 11/21/21 2115  Temp    Pulse 85 11/21/21 2117  Resp 10 11/21/21 2117  SpO2 98 % 11/21/21 2117  Vitals shown include unvalidated device data.  Last Pain:  Vitals:   11/21/21 1845  TempSrc:   PainSc: 5          Complications: No notable events documented.

## 2021-11-22 ENCOUNTER — Encounter: Payer: Self-pay | Admitting: Surgery

## 2021-11-22 LAB — HIV ANTIBODY (ROUTINE TESTING W REFLEX): HIV Screen 4th Generation wRfx: NONREACTIVE

## 2021-11-22 MED ORDER — CIPROFLOXACIN HCL 500 MG PO TABS: 500.0000 mg | ORAL_TABLET | Freq: Two times a day (BID) | ORAL | 0 refills | Status: AC

## 2021-11-22 MED ORDER — OXYCODONE HCL 5 MG PO TABS: 5.0000 mg | ORAL_TABLET | Freq: Four times a day (QID) | ORAL | 0 refills | Status: DC | PRN

## 2021-11-22 MED ORDER — METRONIDAZOLE 500 MG PO TABS: 500.0000 mg | ORAL_TABLET | Freq: Three times a day (TID) | ORAL | 0 refills | Status: AC

## 2021-11-22 MED ORDER — ALUM & MAG HYDROXIDE-SIMETH 200-200-20 MG/5ML PO SUSP: 15.0000 mL | Freq: Four times a day (QID) | ORAL | Status: DC | PRN

## 2021-11-22 MED ORDER — IBUPROFEN 600 MG PO TABS: 600.0000 mg | ORAL_TABLET | Freq: Four times a day (QID) | ORAL | 0 refills | Status: AC | PRN

## 2021-11-22 MED ORDER — PANTOPRAZOLE SODIUM 40 MG PO TBEC
40.0000 mg | DELAYED_RELEASE_TABLET | Freq: Two times a day (BID) | ORAL | Status: DC
  Administered 2021-11-22: 40 mg via ORAL
  Filled 2021-11-22: qty 1

## 2021-11-22 NOTE — Progress Notes (Signed)
Pt A/Ox4 upon review of AVS. Clinic application and med coupons given per CM. Pt requesting worknote and will message PA. No further needs. Patients family driving home.

## 2021-11-22 NOTE — Discharge Instructions (Addendum)
In addition to included general post-operative instructions,  Diet: Resume home diet.   Activity: No heavy lifting >20 pounds (children, pets, laundry, garbage) or strenuous activity for 4 weeks, but light activity and walking are encouraged. Do not drive or drink alcohol if taking narcotic pain medications or having pain that might distract from driving.  Wound care: You may shower/get incision wet with soapy water and pat dry (do not rub incisions), but no baths or submerging incision underwater until follow-up.   Medications: Resume all home medications. For mild to moderate pain: acetaminophen (Tylenol) or ibuprofen/naproxen (if no kidney disease). Combining Tylenol with alcohol can substantially increase your risk of causing liver disease. Narcotic pain medications, if prescribed, can be used for severe pain, though may cause nausea, constipation, and drowsiness. Do not combine Tylenol and Percocet (or similar) within a 6 hour period as Percocet (and similar) contain(s) Tylenol. If you do not need the narcotic pain medication, you do not need to fill the prescription.  Call office [phone]) at any time if any questions, worsening pain, fevers/chills, bleeding, drainage from incision site, or other concerns.

## 2021-11-22 NOTE — TOC Initial Note (Signed)
Transition of Care [facility]) - Initial/Assessment Note    Patient Details  Name: Angie Weber MRN: [id number] Date of Birth: 1980-04-08  [facility] CM/SW Contact:    Beverly Sessions, RN Phone Number: 11/22/2021, 9:37 AM  Clinical Narrative:                  Patient to discharge today Husband to transport at discharge Provided patient with application to [facility]  Provided patient with goodrx coupons for discharge medications. She confirms she will be able to obtain at discharge        Patient Goals and CMS Choice        Expected Discharge Plan and Services           Expected Discharge Date: 11/22/21                                    Prior Living Arrangements/Services                       Activities of Daily Living Home Assistive Devices/Equipment: Contact lenses ADL Screening (condition at time of admission) Patient's cognitive ability adequate to safely complete daily activities?: Yes Is the patient deaf or have difficulty hearing?: No Does the patient have difficulty seeing, even when wearing glasses/contacts?: No Does the patient have difficulty concentrating, remembering, or making decisions?: No Patient able to express need for assistance with ADLs?: Yes Does the patient have difficulty dressing or bathing?: No Independently performs ADLs?: Yes (appropriate for developmental age) Does the patient have difficulty walking or climbing stairs?: No Weakness of Legs: None Weakness of Arms/Hands: None  Permission Sought/Granted                  Emotional Assessment              Admission diagnosis:  Flank pain [R10.9] Acute appendicitis [K35.80] Acute appendicitis with localized peritonitis, without perforation, abscess, or gangrene [K35.30] Patient Active Problem List   Diagnosis Date Noted   Acute appendicitis 11/21/2021   Family history of breast cancer 05/08/2021   Cervical dysplasia 10/05/2018    Pilonidal cyst 09/08/2017   PCP:  Patient, No Pcp Per Pharmacy:   [facility] [address] [place] [address] Phone: [phone] Fax: [phone]  [id number] [place] [address] [place] [place]Phone: [phone]: [phone]    Social Determinants of Health (SDOH) Interventions    Readmission Risk Interventions     No data to display

## 2021-11-22 NOTE — Anesthesia Postprocedure Evaluation (Signed)
Anesthesia Post Note  Patient: Angie Weber  Procedure(s) Performed: APPENDECTOMY LAPAROSCOPIC (Abdomen)  Patient location during evaluation: PACU Anesthesia Type: General Level of consciousness: awake and alert Pain management: pain level controlled Vital Signs Assessment: post-procedure vital signs reviewed and stable Respiratory status: spontaneous breathing, nonlabored ventilation and respiratory function stable Cardiovascular status: blood pressure returned to baseline and stable Postop Assessment: no apparent nausea or vomiting Anesthetic complications: no   No notable events documented.   Last Vitals:  Vitals:   11/22/21 0547 11/22/21 0744  BP: (!) 146/85 (!) 149/96  Pulse: 67 70  Resp:  17  Temp: 36.9 C 36.7 C  SpO2: 98% 99%    Last Pain:  Vitals:   11/22/21 0900  TempSrc:   PainSc: [name]

## 2021-11-22 NOTE — Discharge Summary (Signed)
[facility] SURGICAL DISCHARGE SUMMARY  Patient ID: Angie Weber MRN: [id number] DOB/AGE: 41-Jun-1982 41 y.o.  Admit date: 11/21/2021 Discharge date: 11/22/2021  Discharge Diagnoses Patient Active Problem List   Diagnosis Date Noted   Acute appendicitis 11/21/2021   Family history of breast cancer 05/08/2021   Cervical dysplasia 10/05/2018   Pilonidal cyst 09/08/2017    Consultants None  Procedures 11/21/2021:  Laparoscopic appendectomy   HPI / Hospital Course: LAELA DEVINEY is a 41 y.o. female with acute appendicitis. Informed consent was obtained and documented, and patient underwent uneventful laparoscopic appendectomy (Dr Hampton Abbot, 11/21/2021).  Post-operatively, patient's pain/symptoms improved/resolved and advancement of patient's diet and ambulation were well-tolerated. The remainder of patient's hospital course was essentially unremarkable, and discharge planning was initiated accordingly with patient safely able to be discharged home with appropriate discharge instructions, antibiotics (Cipro, Flagyl x7 days), pain control, and outpatient follow-up after all of her questions were answered to her expressed satisfaction.   Discharge Condition: Good   Physical Examination:  Constitutional: Well appearing female, NAD Pulmonary: Normal effort, no respiratory distress Gastrointestinal: Soft, incisional soreness, non-distended, no rebound/guarding Skin: Laparoscopic incisions are CDI with dermabond, no erythema   Allergies as of 11/22/2021       Reactions   Penicillins Anaphylaxis, Other (See Comments)   Has patient had a PCN reaction causing immediate rash, facial/tongue/throat swelling, SOB or lightheadedness with hypotension: Unknown Has patient had a PCN reaction causing severe rash involving mucus membranes or skin necrosis: Unknown Has patient had a PCN reaction that required hospitalization: Unknown Has patient had a PCN reaction occurring within the  last 10 years: No If all of the above answers are "NO", then may proceed with Cephalosporin use.   Sulfa Antibiotics Hives   SORES IN MOUTH AND THROAT        Medication List     TAKE these medications    ciprofloxacin 500 MG tablet Commonly known as: Cipro Take 1 tablet (500 mg total) by mouth 2 (two) times daily for 7 days.   ibuprofen 600 MG tablet Commonly known as: ADVIL Take 1 tablet (600 mg total) by mouth every 6 (six) hours as needed.   metroNIDAZOLE 500 MG tablet Commonly known as: Flagyl Take 1 tablet (500 mg total) by mouth 3 (three) times daily for 7 days.   norethindrone 5 MG tablet Commonly known as: AYGESTIN Take 1 tablet (5 mg total) by mouth daily.   oxyCODONE 5 MG immediate release tablet Commonly known as: Oxy IR/ROXICODONE Take 1 tablet (5 mg total) by mouth every 6 (six) hours as needed for severe pain or breakthrough pain.          Follow-up Information     Tylene Fantasia, PA-C. Schedule an appointment as soon as possible for a visit in 3 week(s).   Specialty: Physician Assistant Why: s/p laparosopic appendectomy Contact information: [address] [place] [address] [phone]                  Time spent on discharge management including discussion of hospital course, clinical condition, outpatient instructions, prescriptions, and follow up with the patient and members of the medical team: >30 minutes  -- Edison Simon , PA-C [facility]  11/22/2021, 8:28 AM [phone] M-F: 7am - 4pm

## 2021-11-26 LAB — SURGICAL PATHOLOGY

## 2021-12-05 ENCOUNTER — Encounter: Payer: Self-pay | Admitting: Physician Assistant

## 2021-12-05 ENCOUNTER — Ambulatory Visit (INDEPENDENT_AMBULATORY_CARE_PROVIDER_SITE_OTHER): Payer: Self-pay | Admitting: Physician Assistant

## 2021-12-05 VITALS — BP 136/101 | HR 77 | Temp 98.4°F | Wt 213.2 lb

## 2021-12-05 DIAGNOSIS — K3580 Unspecified acute appendicitis: Secondary | ICD-10-CM

## 2021-12-05 DIAGNOSIS — K353 Acute appendicitis with localized peritonitis, without perforation or gangrene: Secondary | ICD-10-CM

## 2021-12-05 DIAGNOSIS — Z09 Encounter for follow-up examination after completed treatment for conditions other than malignant neoplasm: Secondary | ICD-10-CM

## 2021-12-05 NOTE — Progress Notes (Signed)
[facility] POST-OP OFFICE VISIT  12/05/2021  HPI: Angie Weber is a 41 y.o. female 14 days s/p robotic assisted laparoscopic appendectomy for acute appendicitis with Dr Hampton Abbot   She is overall doing well She had pain and discomfort for about 1 week but now only with intermittent crampy pain No fever, chills, emesis, nausea, or bowel changes She does have scant serous drainage from her umbilical wound No other complaints   Vital signs: BP (!) 136/101   Pulse 77   Temp 98.4 F (36.9 C) (Oral)   Wt 213 lb 3.2 oz (96.7 kg)   LMP  (LMP Unknown)   SpO2 97%   BMI 36.03 kg/m    Physical Exam: Constitutional: Well appearing female, NAD Abdomen: Soft, non-tender, non-distended, no rebound/guarding Skin: Her umbilical incision is draining a scant amount of serous fluid from a pinpoint (<1 mm) spot in the medial of this incision. I was able to clean this up and there is no evidence of erythema. No gross evidence of infection. Other laparoscopic sites are healing well without issues.   Assessment/Plan: This is a 41 y.o. female 14 days s/p robotic assisted laparoscopic appendectomy for acute appendicitis with Dr Hampton Abbot    - Pain control prn  - Reviewed wound care recommendation  - Reviewed lifting restrictions; 4 weeks total  - Reviewed surgical pathology; Acute appendicitis   - She can follow up on as needed basis; She understands to call with questions/concerns  -- Edison Simon, PA-C [facility] 12/05/2021, 3:01 PM M-F: 7am - 4pm

## 2021-12-05 NOTE — Patient Instructions (Signed)

## 2022-04-20 ENCOUNTER — Other Ambulatory Visit: Payer: Self-pay | Admitting: Obstetrics and Gynecology

## 2022-04-20 DIAGNOSIS — Z3041 Encounter for surveillance of contraceptive pills: Secondary | ICD-10-CM

## 2022-05-24 ENCOUNTER — Other Ambulatory Visit: Payer: Self-pay | Admitting: Obstetrics and Gynecology

## 2022-05-24 DIAGNOSIS — Z3041 Encounter for surveillance of contraceptive pills: Secondary | ICD-10-CM

## 2022-05-30 ENCOUNTER — Other Ambulatory Visit: Payer: Self-pay | Admitting: Obstetrics and Gynecology

## 2022-05-30 DIAGNOSIS — Z3041 Encounter for surveillance of contraceptive pills: Secondary | ICD-10-CM

## 2022-07-08 ENCOUNTER — Encounter: Payer: Self-pay | Admitting: Obstetrics and Gynecology

## 2022-07-08 ENCOUNTER — Ambulatory Visit (INDEPENDENT_AMBULATORY_CARE_PROVIDER_SITE_OTHER): Payer: Self-pay | Admitting: Obstetrics and Gynecology

## 2022-07-08 VITALS — BP 120/84 | Ht 64.5 in | Wt 218.0 lb

## 2022-07-08 DIAGNOSIS — D069 Carcinoma in situ of cervix, unspecified: Secondary | ICD-10-CM

## 2022-07-08 DIAGNOSIS — G4709 Other insomnia: Secondary | ICD-10-CM

## 2022-07-08 DIAGNOSIS — Z6836 Body mass index (BMI) 36.0-36.9, adult: Secondary | ICD-10-CM

## 2022-07-08 DIAGNOSIS — Z131 Encounter for screening for diabetes mellitus: Secondary | ICD-10-CM

## 2022-07-08 DIAGNOSIS — Z13220 Encounter for screening for lipoid disorders: Secondary | ICD-10-CM

## 2022-07-08 DIAGNOSIS — Z1231 Encounter for screening mammogram for malignant neoplasm of breast: Secondary | ICD-10-CM

## 2022-07-08 DIAGNOSIS — Z0000 Encounter for general adult medical examination without abnormal findings: Secondary | ICD-10-CM

## 2022-07-08 DIAGNOSIS — Z803 Family history of malignant neoplasm of breast: Secondary | ICD-10-CM

## 2022-07-08 DIAGNOSIS — Z01419 Encounter for gynecological examination (general) (routine) without abnormal findings: Secondary | ICD-10-CM

## 2022-07-08 DIAGNOSIS — Z3041 Encounter for surveillance of contraceptive pills: Secondary | ICD-10-CM

## 2022-07-08 MED ORDER — NORETHINDRONE ACETATE 5 MG PO TABS: 5.0000 mg | ORAL_TABLET | Freq: Every day | ORAL | 3 refills | Status: DC

## 2022-07-08 NOTE — Progress Notes (Signed)
PCP:  Patient, No Pcp Per   Chief Complaint  Patient presents with   Gynecologic Exam    No concerns     HPI:      Angie Weber is a 42 y.o. G2P2 whose LMP was No LMP recorded. (Menstrual status: Oral contraceptives)., presents today for her annual examination.  Her menses are absent with aygestin, no BTB, no dysmen. Does have vasomotor sx and trouble falling asleep and staying asleep.  Sex activity: single partner, contraception - oral progesterone-only contraceptive. No pain/bleeding.   Last Pap: 05/08/21 Results were no abnormalities/neg HPV DNA. Can do paps Q3 yrs now.  05/03/20  Results were: no abnormalities /neg HPV DNA ; hx of CIN 3  s/p cold knife cone bx with Dr. Bonney Aid 12/19 10/05/18 Neg pap/neg HPV DNA 12/24/2017 [facility] CIN III clear margins 12/14/2017 Colposcopy ectocervix negative, endocervix with fragments suspicious for high grade 09/07/17 revealed NIL HPV positive 08/07/2016 Colposcopy negative ectocervical bx and endocervical bx 07/02/2016 NIL HPV positive 07/25/2015 NIL HPV positive  Last mammogram: 09/21/18 Results were: normal--routine follow-up in 12 months There is a significant FH of breast cancer on her mat side. There is no FH of ovarian cancer. Pt is MyRisk neg 2017; IBIS=17.2%. The patient does self-breast exams.  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   Tobacco use: quit Alcohol use: rare No drug use.  Exercise: moderately active  She does get adequate calcium but not Vitamin D in her diet. No recent fasting labs. Is very tired, having trouble sleeping.    Past Medical History:  Diagnosis Date   Anemia    H/O AS A CHILD   BRCA negative 08/17/2015   MyRisk   Family history of breast cancer    2017 IBIS=17%   GERD (gastroesophageal reflux disease)    OCC-NO MEDS   Headache    MIGRAINES   History of kidney stones    Increased risk of breast cancer     Past Surgical History:   Procedure Laterality Date   APPENDECTOMY     BREAST BIOPSY Right 07/23/2015   BENIGN BREAST TISSUE WITH INTRADUCTAL PAPILLOMA AND COLUMNAR CELL    BREAST BIOPSY Left 2017   NEG   BREAST EXCISIONAL BIOPSY Right 2017   NEG   BREAST LUMPECTOMY Right 01/11/2016   Procedure: BREAST LUMPECTOMY;  Surgeon: Kieth Brightly, MD;  Location: [facility];  Service: General;  Laterality: Right;   CERVICAL CONIZATION W/BX N/A 02/23/2018   Procedure: COLD KNIFE CONIZATION CERVIX WITH BIOPSY;  Surgeon: Vena Austria, MD;  Location: [facility];  Service: Gynecology;  Laterality: N/A;   LAPAROSCOPIC APPENDECTOMY N/A 11/21/2021   Procedure: APPENDECTOMY LAPAROSCOPIC;  Surgeon: Henrene Dodge, MD;  Location: [facility];  Service: General;  Laterality: N/A;   TOOTH EXTRACTION  04/05/2018   TUBAL LIGATION     WISDOM TOOTH EXTRACTION      Family History  Problem Relation Age of Onset   Breast cancer Maternal Aunt        4 mat. aunts 31's and 40's   Breast cancer Maternal Grandmother    Breast cancer Cousin        5 maternal cousins   Breast cancer Maternal Aunt    Breast cancer Maternal Aunt    Breast cancer Maternal Aunt    Breast cancer Maternal Aunt     Social History   Socioeconomic History   Marital status: Married  Spouse name: Not on file   Number of children: Not on file   Years of education: Not on file   Highest education level: Not on file  Occupational History   Not on file  Tobacco Use   Smoking status: Former    Packs/day: 0.50    Years: 17.00    Additional pack years: 0.00    Total pack years: 8.50    Types: Cigarettes   Smokeless tobacco: Never  Vaping Use   Vaping Use: Never used  Substance and Sexual Activity   Alcohol use: No    Alcohol/week: 0.0 standard drinks of alcohol   Drug use: No   Sexual activity: Yes    Birth control/protection: Pill  Other Topics Concern   Not on file  Social History Narrative   Not on file   Social Determinants of Health    Financial Resource Strain: Not on file  Food Insecurity: Not on file  Transportation Needs: Not on file  Physical Activity: Not on file  Stress: Not on file  Social Connections: Not on file  Intimate Partner Violence: Not on file     Current Outpatient Medications:    ibuprofen (ADVIL) 600 MG tablet, Take 1 tablet (600 mg total) by mouth every 6 (six) hours as needed., Disp: 30 tablet, Rfl: 0   norethindrone (AYGESTIN) 5 MG tablet, Take 1 tablet (5 mg total) by mouth daily., Disp: 90 tablet, Rfl: 3     ROS:  Review of Systems  Constitutional:  Positive for fatigue. Negative for fever and unexpected weight change.  Respiratory:  Negative for cough, shortness of breath and wheezing.   Cardiovascular:  Negative for chest pain, palpitations and leg swelling.  Gastrointestinal:  Negative for blood in stool, constipation, diarrhea, nausea and vomiting.  Endocrine: Negative for cold intolerance, heat intolerance and polyuria.  Genitourinary:  Negative for dyspareunia, dysuria, flank pain, frequency, genital sores, hematuria, menstrual problem, pelvic pain, urgency, vaginal bleeding, vaginal discharge and vaginal pain.  Musculoskeletal:  Negative for back pain, joint swelling and myalgias.  Skin:  Negative for rash.  Neurological:  Negative for dizziness, syncope, light-headedness, numbness and headaches.  Hematological:  Negative for adenopathy.  Psychiatric/Behavioral:  Positive for sleep disturbance. Negative for agitation, confusion and suicidal ideas. The patient is not nervous/anxious.    BREAST: No symptoms   Objective: BP 120/84   Ht 5' 4.5" (1.638 m)   Wt 218 lb (98.9 kg)   BMI 36.84 kg/m    Physical Exam Constitutional:      Appearance: She is well-developed.  Genitourinary:     Vulva normal.     Right Labia: No rash, tenderness or lesions.    Left Labia: No tenderness, lesions or rash.    No vaginal discharge, erythema or tenderness.      Right Adnexa: not  tender and no mass present.    Left Adnexa: not tender and no mass present.    No cervical friability or polyp.     Uterus is not enlarged or tender.  Breasts:    Right: No mass, nipple discharge, skin change or tenderness.     Left: No mass, nipple discharge, skin change or tenderness.  Neck:     Thyroid: No thyromegaly.  Cardiovascular:     Rate and Rhythm: Normal rate and regular rhythm.     Heart sounds: Normal heart sounds. No murmur heard. Pulmonary:     Effort: Pulmonary effort is normal.     Breath sounds: Normal breath sounds.  Abdominal:     Palpations: Abdomen is soft.     Tenderness: There is no abdominal tenderness. There is no guarding or rebound.  Musculoskeletal:        General: Normal range of motion.     Cervical back: Normal range of motion.  Lymphadenopathy:     Cervical: No cervical adenopathy.  Neurological:     General: No focal deficit present.     Mental Status: She is alert and oriented to person, place, and time.     Cranial Nerves: No cranial nerve deficit.  Skin:    General: Skin is warm and dry.  Psychiatric:        Mood and Affect: Mood normal.        Behavior: Behavior normal.        Thought Content: Thought content normal.        Judgment: Judgment normal.  Vitals reviewed.     Assessment/Plan: Encounter for annual routine gynecological examination  High grade squamous intraepithelial lesion (HGSIL), grade 3 CIN, on biopsy of cervix; pap Q3 yrs now, pt ok with that.   Encounter for surveillance of contraceptive pills - Plan: norethindrone (AYGESTIN) 5 MG tablet; Rx RF. Doing well.   Encounter for screening mammogram for malignant neoplasm of breast - Plan: MM 3D SCREENING MAMMOGRAM BILATERAL BREAST; pt to schedule mammo  Family history of breast cancer - Plan: MM 3D SCREENING MAMMOGRAM BILATERAL BREAST; pt is MyRisk neg, no increased screening needed  Blood tests for routine general physical examination - Plan: Comprehensive metabolic  panel, Hemoglobin A1c, Lipid panel  Screening cholesterol level - Plan: Lipid panel  Screening for diabetes mellitus - Plan: Hemoglobin A1c  BMI 36.0-36.9,adult - Plan: Comprehensive metabolic panel, Hemoglobin A1c, Lipid panel  Other insomnia--try unisom, increase exercise.    Meds ordered this encounter  Medications   norethindrone (AYGESTIN) 5 MG tablet    Sig: Take 1 tablet (5 mg total) by mouth daily.    Dispense:  90 tablet    Refill:  3    Order Specific Question:   Supervising Provider    Answer:   Waymon Budge             GYN counsel breast self exam, mammography screening, adequate intake of calcium and vitamin D, diet and exercise     F/U  Return in about 1 year (around 07/08/2023).  Jamaury Gumz B. Gilberto Stanforth, PA-C 07/08/2022 3:40 PM

## 2022-07-08 NOTE — Patient Instructions (Signed)
I value your feedback and you entrusting us with your care. If you get a [facility] patient survey, I would appreciate you taking the time to let us know about your experience today. Thank you!  [facility]: [phone]      

## 2023-07-30 ENCOUNTER — Other Ambulatory Visit: Payer: Self-pay

## 2023-07-30 DIAGNOSIS — Z3041 Encounter for surveillance of contraceptive pills: Secondary | ICD-10-CM

## 2023-07-30 MED ORDER — NORETHINDRONE ACETATE 5 MG PO TABS: 5.0000 mg | ORAL_TABLET | Freq: Every day | ORAL | 0 refills | Status: DC

## 2023-07-30 NOTE — Telephone Encounter (Signed)
 Pt called triage requesting RF of norethindrone  (AYGESTIN ) 5 MG tablet. Annual scheduled on 08/17/23. 1 RF sent, pt aware.

## 2023-08-17 ENCOUNTER — Telehealth: Payer: Self-pay

## 2023-08-17 ENCOUNTER — Encounter: Payer: Self-pay | Admitting: Obstetrics and Gynecology

## 2023-08-17 ENCOUNTER — Ambulatory Visit (INDEPENDENT_AMBULATORY_CARE_PROVIDER_SITE_OTHER): Payer: Self-pay | Admitting: Obstetrics and Gynecology

## 2023-08-17 VITALS — BP 124/69 | HR 67 | Ht 64.5 in | Wt 191.0 lb

## 2023-08-17 DIAGNOSIS — Z01419 Encounter for gynecological examination (general) (routine) without abnormal findings: Secondary | ICD-10-CM

## 2023-08-17 DIAGNOSIS — Z1231 Encounter for screening mammogram for malignant neoplasm of breast: Secondary | ICD-10-CM

## 2023-08-17 DIAGNOSIS — Z124 Encounter for screening for malignant neoplasm of cervix: Secondary | ICD-10-CM

## 2023-08-17 DIAGNOSIS — D069 Carcinoma in situ of cervix, unspecified: Secondary | ICD-10-CM

## 2023-08-17 DIAGNOSIS — Z1151 Encounter for screening for human papillomavirus (HPV): Secondary | ICD-10-CM

## 2023-08-17 DIAGNOSIS — Z3041 Encounter for surveillance of contraceptive pills: Secondary | ICD-10-CM

## 2023-08-17 DIAGNOSIS — Z803 Family history of malignant neoplasm of breast: Secondary | ICD-10-CM

## 2023-08-17 MED ORDER — NORETHINDRONE ACETATE 5 MG PO TABS: 5.0000 mg | ORAL_TABLET | Freq: Every day | ORAL | 3 refills | Status: AC

## 2023-08-17 NOTE — Patient Instructions (Addendum)
 I value your feedback and you entrusting [place] with your care. If you get a [facility] patient survey, I would appreciate you taking the time to let us know about your experience today. Thank you!  [facility] ([place])--[phone]

## 2023-08-17 NOTE — Telephone Encounter (Signed)
 Pt is self pay, Paramedic. Called pt to clarify who does she want us  to send pap smear to? For billing purposes. She wants it sent to [facility]. Mia aware.

## 2023-08-17 NOTE — Progress Notes (Signed)
 PCP:  Patient, No Pcp Per   Chief Complaint  Patient presents with   Gynecologic Exam    No concerns     HPI:      Angie Weber is a 43 y.o. G2P2 whose LMP was No LMP recorded. (Menstrual status: Oral contraceptives)., presents today for her annual examination.  Her menses are absent with aygestin , no BTB, no dysmen.   Sex activity: single partner, contraception - oral progesterone-only contraceptive. No pain/bleeding/dryness.  Last Pap: 05/08/21 Results were no abnormalities/neg HPV DNA. Can do paps Q3 yrs now.  05/03/20  Results were: no abnormalities /neg HPV DNA  10/05/18 Neg pap/neg HPV DNA 12/24/2017  hx of CIN 3  s/p cold knife cone bx with clear margins with Dr. Clemetine Cypher 12/19 12/14/2017 Colposcopy ectocervix negative, endocervix with fragments suspicious for high grade 09/07/17 revealed NIL HPV positive 08/07/2016 Colposcopy negative ectocervical bx and endocervical bx 07/02/2016 NIL HPV positive 07/25/2015 NIL HPV positive  Last mammogram: 09/21/18 Results were: normal--routine follow-up in 12 months; no recent mammo due to no insurance. There is a significant FH of breast cancer on her mat side. There is no FH of ovarian cancer. Pt is MyRisk neg 2017; IBIS=17.2%. The patient does self-breast exams.  Pt is s/p RT lumpectomy with Dr. Lorel Roes in 2017 for intraductal papilloma with sclerosis; neg margins.   Tobacco use: quit Alcohol use: rare No drug use.  Exercise: moderately active  She does get adequate calcium and Vitamin D in her diet. No recent fasting labs, will do through work screen this fall.  PT IS SELF PAY BUT WORKS FOR [employer]  Past Medical History:  Diagnosis Date   Anemia    H/O AS A CHILD   BRCA negative 08/17/2015   MyRisk   Family history of breast cancer    2017 IBIS=17%   GERD (gastroesophageal reflux disease)    OCC-NO MEDS   Headache    MIGRAINES   History of kidney stones    Increased risk of breast cancer     Past Surgical History:   Procedure Laterality Date   APPENDECTOMY     BREAST BIOPSY Right 07/23/2015   BENIGN BREAST TISSUE WITH INTRADUCTAL PAPILLOMA AND COLUMNAR CELL    BREAST BIOPSY Left 2017   NEG   BREAST EXCISIONAL BIOPSY Right 2017   NEG   BREAST LUMPECTOMY Right 01/11/2016   Procedure: BREAST LUMPECTOMY;  Surgeon: Jerlean Mood, MD;  Location: [facility];  Service: General;  Laterality: Right;   CERVICAL CONIZATION W/BX N/A 02/23/2018   Procedure: COLD KNIFE CONIZATION CERVIX WITH BIOPSY;  Surgeon: Darl Edu, MD;  Location: [facility];  Service: Gynecology;  Laterality: N/A;   LAPAROSCOPIC APPENDECTOMY N/A 11/21/2021   Procedure: APPENDECTOMY LAPAROSCOPIC;  Surgeon: Emmalene Hare, MD;  Location: [facility];  Service: General;  Laterality: N/A;   TOOTH EXTRACTION  04/05/2018   TUBAL LIGATION     WISDOM TOOTH EXTRACTION      Family History  Problem Relation Age of Onset   Breast cancer Maternal Aunt        4 mat. aunts 30's and 40's   Breast cancer Maternal Grandmother    Breast cancer Cousin        5 maternal cousins   Breast cancer Maternal Aunt    Breast cancer Maternal Aunt    Breast cancer Maternal Aunt    Breast cancer Maternal Aunt     Social History   Socioeconomic History   Marital status: Married  Spouse name: Not on file   Number of children: Not on file   Years of education: Not on file   Highest education level: Not on file  Occupational History   Not on file  Tobacco Use   Smoking status: Former    Current packs/day: 0.50    Average packs/day: 0.5 packs/day for 17.0 years (8.5 ttl pk-yrs)    Types: Cigarettes   Smokeless tobacco: Never  Vaping Use   Vaping status: Never Used  Substance and Sexual Activity   Alcohol use: No    Alcohol/week: 0.0 standard drinks of alcohol   Drug use: No   Sexual activity: Yes    Birth control/protection: Pill  Other Topics Concern   Not on file  Social History Narrative   Not on file   Social Drivers of Health    Financial Resource Strain: Not on file  Food Insecurity: Not on file  Transportation Needs: Not on file  Physical Activity: Not on file  Stress: Not on file  Social Connections: Not on file  Intimate Partner Violence: Not on file     Current Outpatient Medications:    ibuprofen  (ADVIL ) 600 MG tablet, Take 1 tablet (600 mg total) by mouth every 6 (six) hours as needed., Disp: 30 tablet, Rfl: 0   norethindrone  (AYGESTIN ) 5 MG tablet, Take 1 tablet (5 mg total) by mouth daily., Disp: 90 tablet, Rfl: 3     ROS:  Review of Systems  Constitutional:  Positive for fatigue. Negative for fever and unexpected weight change.  Respiratory:  Negative for cough, shortness of breath and wheezing.   Cardiovascular:  Negative for chest pain, palpitations and leg swelling.  Gastrointestinal:  Positive for constipation. Negative for blood in stool, diarrhea, nausea and vomiting.  Endocrine: Negative for cold intolerance, heat intolerance and polyuria.  Genitourinary:  Negative for dyspareunia, dysuria, flank pain, frequency, genital sores, hematuria, menstrual problem, pelvic pain, urgency, vaginal bleeding, vaginal discharge and vaginal pain.  Musculoskeletal:  Negative for back pain, joint swelling and myalgias.  Skin:  Negative for rash.  Neurological:  Negative for dizziness, syncope, light-headedness, numbness and headaches.  Hematological:  Negative for adenopathy.  Psychiatric/Behavioral:  Positive for sleep disturbance. Negative for agitation, confusion and suicidal ideas. The patient is not nervous/anxious.    BREAST: No symptoms   Objective: BP 124/69   Pulse 67   Ht 5' 4.5 (1.638 m)   Wt 191 lb (86.6 kg)   BMI 32.28 kg/m    Physical Exam Constitutional:      Appearance: She is well-developed.  Genitourinary:     Vulva normal.     Right Labia: No rash, tenderness or lesions.    Left Labia: No tenderness, lesions or rash.    No vaginal discharge, erythema or tenderness.       Right Adnexa: not tender and no mass present.    Left Adnexa: not tender and no mass present.    No cervical friability or polyp.     Uterus is not enlarged or tender.  Breasts:    Right: No mass, nipple discharge, skin change or tenderness.     Left: No mass, nipple discharge, skin change or tenderness.  Neck:     Thyroid: No thyromegaly.   Cardiovascular:     Rate and Rhythm: Normal rate and regular rhythm.     Heart sounds: Normal heart sounds. No murmur heard. Pulmonary:     Effort: Pulmonary effort is normal.     Breath sounds: Normal  breath sounds.  Abdominal:     Palpations: Abdomen is soft.     Tenderness: There is no abdominal tenderness. There is no guarding or rebound.   Musculoskeletal:        General: Normal range of motion.     Cervical back: Normal range of motion.  Lymphadenopathy:     Cervical: No cervical adenopathy.   Neurological:     General: No focal deficit present.     Mental Status: She is alert and oriented to person, place, and time.     Cranial Nerves: No cranial nerve deficit.   Skin:    General: Skin is warm and dry.   Psychiatric:        Mood and Affect: Mood normal.        Behavior: Behavior normal.        Thought Content: Thought content normal.        Judgment: Judgment normal.  Vitals reviewed.     Assessment/Plan: Encounter for annual routine gynecological examination  Cervical cancer screening - Plan: IGP, Aptima HPV  Screening for HPV (human papillomavirus) - Plan: IGP, Aptima HPV  High grade squamous intraepithelial lesion (HGSIL), grade 3 CIN, on biopsy of cervix - Plan: IGP, Aptima HPV  Encounter for surveillance of contraceptive pills - Plan: norethindrone  (AYGESTIN ) 5 MG tablet; Rx RF eRxd.   Encounter for screening mammogram for malignant neoplasm of breast - Plan: MM 3D SCREENING MAMMOGRAM BILATERAL BREAST; encouraged mammo and to try self-pay rate vs [employer]  Family history of breast cancer - Plan:  MM 3D SCREENING MAMMOGRAM BILATERAL BREAST   Meds ordered this encounter  Medications   norethindrone  (AYGESTIN ) 5 MG tablet    Sig: Take 1 tablet (5 mg total) by mouth daily.    Dispense:  90 tablet    Refill:  3    Supervising Provider:   ROBY, MICIA [[id number]]             GYN counsel breast self exam, mammography screening, adequate intake of calcium and vitamin D, diet and exercise     F/U  Return in about 1 year (around 08/16/2024).  Angie Weber B. Sacha Radloff, PA-C 08/17/2023 4:58 PM

## 2023-08-21 LAB — IGP, APTIMA HPV: HPV Aptima: NEGATIVE
